# Patient Record
Sex: Female | Born: 2000 | Race: White | Hispanic: No | Marital: Single | Smoking: Never smoker
Health system: Southern US, Community
[De-identification: ages and names within clinical notes are randomized; demographics above are authoritative.]

## PROBLEM LIST (undated history)

## (undated) DIAGNOSIS — O039 Complete or unspecified spontaneous abortion without complication: Secondary | ICD-10-CM

## (undated) DIAGNOSIS — N83209 Unspecified ovarian cyst, unspecified side: Secondary | ICD-10-CM

## (undated) DIAGNOSIS — G43009 Migraine without aura, not intractable, without status migrainosus: Secondary | ICD-10-CM

## (undated) DIAGNOSIS — A749 Chlamydial infection, unspecified: Secondary | ICD-10-CM

## (undated) HISTORY — DX: Chlamydial infection, unspecified: A74.9

## (undated) HISTORY — DX: Migraine without aura, not intractable, without status migrainosus: G43.009

## (undated) HISTORY — PX: WISDOM TOOTH EXTRACTION: SHX21

---
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
## 2018-09-09 DIAGNOSIS — O039 Complete or unspecified spontaneous abortion without complication: Secondary | ICD-10-CM

## 2018-09-09 HISTORY — DX: Complete or unspecified spontaneous abortion without complication: O03.9

## 2018-09-11 ENCOUNTER — Encounter: Payer: Self-pay | Admitting: Certified Nurse Midwife

## 2018-09-11 ENCOUNTER — Other Ambulatory Visit: Payer: Self-pay

## 2018-09-11 ENCOUNTER — Telehealth: Payer: Self-pay

## 2018-09-11 ENCOUNTER — Ambulatory Visit (INDEPENDENT_AMBULATORY_CARE_PROVIDER_SITE_OTHER): Payer: Self-pay | Admitting: Certified Nurse Midwife

## 2018-09-11 ENCOUNTER — Other Ambulatory Visit: Payer: Self-pay | Admitting: Certified Nurse Midwife

## 2018-09-11 VITALS — BP 90/70 | Ht 61.0 in | Wt 112.0 lb

## 2018-09-11 DIAGNOSIS — O209 Hemorrhage in early pregnancy, unspecified: Secondary | ICD-10-CM

## 2018-09-11 NOTE — Progress Notes (Addendum)
Obstetrics & Gynecology Office Visit   Chief Complaint:  Chief Complaint  Patient presents with  . Vaginal Bleeding    since yesterday, severe cramping and lower back pain    History of Present Illness: 18 year old WF G1 P0 with LMP 08/11/2018 had a positive UPT 2 days ago. She has been on OCPs for 2-3 years and had missed some pills, so she took a pregnancy test when there was a delay in onset of her menses earlier this week. She started bleeding and cramping yesterday Am at 0500. She has had light to moderate bleeding since then and continus to have some cramping. She was seen by Dr Noralyn Pick at [facility] yesterday and had a beta HCG done. The results were called to her this AM, but she did not bring those results with her.  She is ambivalent about the pregnancy and is anxious regarding the pregnancy and the bleeding. Has never had a pelvic exam. Past medical history is remarkable for chronic pelvic pain, migraine without aura, and past hx of Chlamydia (2018)    Review of Systems:  Review of Systems  Constitutional: Negative for chills, fever and weight loss.  HENT: Negative for congestion, sinus pain and sore throat.   Eyes: Negative for blurred vision and pain.  Respiratory: Negative for hemoptysis, shortness of breath and wheezing.   Cardiovascular: Negative for chest pain, palpitations and leg swelling.  Gastrointestinal: Positive for abdominal pain (abdominal cramping). Negative for blood in stool, diarrhea, heartburn, nausea and vomiting.  Genitourinary: Negative for dysuria, frequency, hematuria and urgency.       Positive for vaginal bleeding  Musculoskeletal: Negative for back pain, joint pain and myalgias.  Skin: Negative for itching and rash.  Neurological: Positive for dizziness. Negative for tingling and headaches.  Endo/Heme/Allergies: Negative for environmental allergies and polydipsia. Does not bruise/bleed easily.       Negative for hirsutism    Psychiatric/Behavioral: Negative for depression. The patient is not nervous/anxious and does not have insomnia.    -see H&P  Past Medical History:  Past Medical History:  Diagnosis Date  . Chlamydia infection 2018  . Migraine without aura   . Ovarian cyst     Past Surgical History:  Past Surgical History:  Procedure Laterality Date  . WISDOM TOOTH EXTRACTION      Gynecologic History: Patient's last menstrual period was 08/11/2018 (exact date).  Obstetric History: G1P0  Family History:  Family History  Family history unknown: Yes    Social History:  Social History   Socioeconomic History  . Marital status: Single    Spouse name: Not on file  . Number of children: Not on file  . Years of education: Not on file  . Highest education level: Not on file  Occupational History  . Not on file  Social Needs  . Financial resource strain: Not on file  . Food insecurity:    Worry: Not on file    Inability: Not on file  . Transportation needs:    Medical: Not on file    Non-medical: Not on file  Tobacco Use  . Smoking status: Never Smoker  . Smokeless tobacco: Never Used  Substance and Sexual Activity  . Alcohol use: No  . Drug use: Never  . Sexual activity: Yes    Birth control/protection: None  Lifestyle  . Physical activity:    Days per week: Not on file    Minutes per session: Not on file  . Stress: Not  on file  Relationships  . Social connections:    Talks on phone: Not on file    Gets together: Not on file    Attends religious service: Not on file    Active member of club or organization: Not on file    Attends meetings of clubs or organizations: Not on file    Relationship status: Not on file  . Intimate partner violence:    Fear of current or ex partner: Not on file    Emotionally abused: Not on file    Physically abused: Not on file    Forced sexual activity: Not on file  Other Topics Concern  . Not on file  Social History Narrative  . Not on file     Allergies:  No Known Allergies  Medications: Prior to Admission medications   Not on File    Physical Exam Vitals: BP 90/70   Ht 5\' 1"  (1.549 m)   Wt 112 lb (50.8 kg)   LMP 08/11/2018 (Exact Date)   BMI 21.16 kg/m    Physical Exam  Constitutional: She is oriented to person, place, and time. She appears well-developed and well-nourished. No distress.  Respiratory: Effort normal.  GI: Soft. She exhibits no distension and no mass. There is no abdominal tenderness. There is no guarding.  Genitourinary:    Genitourinary Comments: External/BUS: some blood on labia/ no lesions or inflammation Vagina: moderate blood in vault Cervix: cervix appears closed, moble, NT Uterus: AV, NSSC, mobile, NT.    Musculoskeletal: Normal range of motion.  Neurological: She is alert and oriented to person, place, and time.  Skin: Skin is warm and dry.  Psychiatric: She has a normal mood and affect.  Appears anxious   Informal vaginal ultrasound was done which revealed an empty uterus with EM stripe measuring 1.6 mm There were no adnexal masses seen   Assessment: 18 y.o. G1P0 with bleeding in early pregnancy R/O ectopic R/O spontaneous abortion  Plan: ABO RH today To return tomorrow for beta HCG Will call [facility] for beta HCG results yesterday Discussed differential diagnosis with patient and ned for further beta HCGs and possibly another ultrasound to determine diagnosis. Will call patient with results and let her know if Rhogam is indicated  Farrel Conners, CNM    Addendum on 09/13/2018 Beta HCG on 3/18=6 Beta HCG 3/20=1 Blood type: A POS  A: Complete SAB Rhogam not indicated  P: Patient called with results 3/21 and diagnosis Advised can restart pills on 3/22 and use back up birth control x 7 days  Farrel Conners, [place]

## 2018-09-11 NOTE — Telephone Encounter (Signed)
Pt found out at [facility] that she is pregnant.  The blood work that tells how far along she is isn't back yet.  She is bleeding pretty heavily.  What to do?  [phone]  Pt has since called front desk and appt made c [facility] this pm.

## 2018-09-12 ENCOUNTER — Other Ambulatory Visit: Payer: Self-pay

## 2018-09-12 ENCOUNTER — Other Ambulatory Visit (INDEPENDENT_AMBULATORY_CARE_PROVIDER_SITE_OTHER): Payer: Self-pay

## 2018-09-12 DIAGNOSIS — O209 Hemorrhage in early pregnancy, unspecified: Secondary | ICD-10-CM

## 2018-09-12 LAB — ABO/RH: Rh Factor: POSITIVE

## 2018-09-13 DIAGNOSIS — G43009 Migraine without aura, not intractable, without status migrainosus: Secondary | ICD-10-CM | POA: Insufficient documentation

## 2018-09-13 LAB — BETA HCG QUANT (REF LAB): hCG Quant: 1 m[IU]/mL

## 2018-09-13 LAB — ABO/RH: Rh Factor: POSITIVE

## 2018-09-23 ENCOUNTER — Telehealth: Payer: Self-pay

## 2018-09-23 DIAGNOSIS — O039 Complete or unspecified spontaneous abortion without complication: Secondary | ICD-10-CM

## 2018-09-23 DIAGNOSIS — Z3041 Encounter for surveillance of contraceptive pills: Secondary | ICD-10-CM

## 2018-09-23 NOTE — Telephone Encounter (Signed)
Pt was told she has had a complete miscarriage.  She is looking to get back on bcp.  Didn't know if she can do that thru [place] or not.  [phone]

## 2018-09-26 DIAGNOSIS — O039 Complete or unspecified spontaneous abortion without complication: Secondary | ICD-10-CM | POA: Insufficient documentation

## 2018-09-26 DIAGNOSIS — Z309 Encounter for contraceptive management, unspecified: Secondary | ICD-10-CM | POA: Insufficient documentation

## 2018-09-26 MED ORDER — NORETHIN ACE-ETH ESTRAD-FE 1-20 MG-MCG(24) PO TABS: 1.0000 | ORAL_TABLET | Freq: Every day | ORAL | 6 refills | Status: DC

## 2018-09-26 NOTE — Telephone Encounter (Signed)
Patient called. Had a complete SAB recently. Has been prescribed Sprintec Normajean Baxter) in the past by [employer]. Would like to go back on pills. Is no longer bleeding. Advised can prescribe pills for her and have her follow up in 6 months. RX for [employer] 24 sent to [facility] in [place]. Can start 5 April. To use condoms until back on pills two weeks. Aware she may have some BTB on the first pack. Advised to set her alarm on her phone to remind her to take the pills same time every day. Farrel Conners, CNM

## 2018-12-28 ENCOUNTER — Other Ambulatory Visit: Payer: Self-pay

## 2018-12-28 ENCOUNTER — Encounter: Payer: Self-pay | Admitting: Emergency Medicine

## 2018-12-28 ENCOUNTER — Emergency Department
Admission: EM | Admit: 2018-12-28 | Discharge: 2018-12-28 | Disposition: A | Discharge status: Home / Self Care | Payer: PRIVATE HEALTH INSURANCE | Attending: Emergency Medicine | Admitting: Emergency Medicine

## 2018-12-28 DIAGNOSIS — Z79899 Other long term (current) drug therapy: Secondary | ICD-10-CM | POA: Insufficient documentation

## 2018-12-28 DIAGNOSIS — K0889 Other specified disorders of teeth and supporting structures: Secondary | ICD-10-CM

## 2018-12-28 MED ORDER — AMOXICILLIN 875 MG PO TABS: 875.0000 mg | ORAL_TABLET | Freq: Two times a day (BID) | ORAL | 0 refills | Status: DC

## 2018-12-28 MED ORDER — IBUPROFEN 600 MG PO TABS: 600.0000 mg | ORAL_TABLET | Freq: Three times a day (TID) | ORAL | 0 refills | Status: DC | PRN

## 2018-12-28 NOTE — ED Triage Notes (Addendum)
Pt c/o right side dental pain radiating towards ear x 3 days. Pt thinks she has an abscess in mouth.

## 2018-12-28 NOTE — Discharge Instructions (Addendum)
Call tomorrow to make an appointment with your dentist.  Begin taking amoxicillin 875 twice daily for 10 days.  Ibuprofen 600 mg with food as needed for pain.  Also be aware that the amoxicillin will decrease the effects of your birth control pill and use additional protection when having sex.

## 2018-12-28 NOTE — ED Notes (Signed)
See triage note   States she developed pain to right jaw area about 3 days ago    States pain radiates into ear   Denies any dental pain or injury

## 2018-12-28 NOTE — ED Provider Notes (Signed)
[facility] Emergency Department Provider Note   ____________________________________________   First MD Initiated Contact with Patient 12/28/18 1006     (approximate)  I have reviewed the triage vital signs and the nursing notes.   HISTORY  Chief Complaint Dental Pain   HPI Sara Campbell is a 18 y.o. female presents to the ED with complaint of dental pain.  Patient states that her right lower jaw began hurting approximately 3 days ago with some radiation into her right ear.  She denies any fever or chills.  Patient does have a dentist but has not called to make an appointment.  Pain is rated as a 10/10.      Past Medical History:  Diagnosis Date  . Chlamydia infection 2018  . Migraine without aura   . Ovarian cyst     Patient Active Problem List   Diagnosis Date Noted  . SAB (spontaneous abortion) 09/26/2018  . Contraception management 09/26/2018  . Migraine without aura 09/13/2018    Past Surgical History:  Procedure Laterality Date  . WISDOM TOOTH EXTRACTION      Prior to Admission medications   Medication Sig Start Date End Date Taking? Authorizing Provider  amoxicillin (AMOXIL) 875 MG tablet Take 1 tablet (875 mg total) by mouth 2 (two) times daily. 12/28/18   Tommi RumpsSummers, Rhonda L, PA-C  ibuprofen (ADVIL) 600 MG tablet Take 1 tablet (600 mg total) by mouth every 8 (eight) hours as needed. 12/28/18   Tommi RumpsSummers, Rhonda L, PA-C  Norethindrone Acetate-Ethinyl Estrad-FE (JUNEL FE 24) 1-20 MG-MCG(24) tablet Take 1 tablet by mouth daily. 09/26/18   Farrel ConnersGutierrez, Colleen, CNM    Allergies Patient has no known allergies.  Family History  Family history unknown: Yes    Social History Social History   Tobacco Use  . Smoking status: Never Smoker  . Smokeless tobacco: Never Used  Substance Use Topics  . Alcohol use: No  . Drug use: Never    Review of Systems Constitutional: No fever/chills Mouth: Positive for dental pain. Cardiovascular: Denies  chest pain. Respiratory: Denies shortness of breath. Musculoskeletal: Negative for muscle aches. Skin: Negative for rash. Neurological: Negative for headaches, focal weakness or numbness. ___________________________________________   PHYSICAL EXAM:  VITAL SIGNS: ED Triage Vitals  Enc Vitals Group     BP 12/28/18 1003 (!) 138/98     Pulse Rate 12/28/18 1003 90     Resp 12/28/18 1003 18     Temp 12/28/18 1003 99.1 F (37.3 C)     Temp Source 12/28/18 1003 Oral     SpO2 12/28/18 1003 99 %     Weight 12/28/18 1004 112 lb (50.8 kg)     Height 12/28/18 1004 5\' 2"  (1.575 m)     Head Circumference --      Peak Flow --      Pain Score 12/28/18 1004 10     Pain Loc --      Pain Edu? --      Excl. in GC? --     Constitutional: Alert and oriented. Well appearing and in no acute distress. Eyes: Conjunctivae are normal.  Head: Atraumatic. Nose: No congestion/rhinnorhea. Mouth/Throat: Mucous membranes are moist.  Oropharynx non-erythematous.  Moderate tenderness on palpation of the gums on the lower posterior molars.  No actual abscess was seen no drainage.  No dental caries are appreciated. Neck: No stridor.   Cardiovascular: Normal rate, regular rhythm. Grossly normal heart sounds.  Good peripheral circulation. Respiratory: Normal respiratory effort.  No  retractions. Lungs CTAB. Gastrointestinal: Soft and nontender. No distention.  Musculoskeletal: Moves upper and lower extremities without any difficulty.  Normal gait was noted. Neurologic:  Normal speech and language. No gross focal neurologic deficits are appreciated. No gait instability. Skin:  Skin is warm, dry and intact. No rash noted. Psychiatric: Mood and affect are normal. Speech and behavior are normal.  ____________________________________________   LABS (all labs ordered are listed, but only abnormal results are displayed)  Labs Reviewed - No data to display  PROCEDURES  Procedure(s) performed (including Critical  Care):  Procedures   ____________________________________________   INITIAL IMPRESSION / ASSESSMENT AND PLAN / ED COURSE  As part of my medical decision making, I reviewed the following data within the electronic MEDICAL RECORD NUMBER Notes from prior ED visits and [place] Controlled Substance Database  18 year old female presents to the ED with complaint of dental pain for 3 days.  Patient states that she feels like she has an abscess in her mouth.  She denies any fever or chills.  Examination shows moderate tenderness on palpation of the gums right posterior lower without an  abscess seen.  Patient was placed on amoxicillin 875 twice daily for 10 days with ibuprofen as needed for pain.  Patient is to contact her dentist for an appointment.  ____________________________________________   FINAL CLINICAL IMPRESSION(S) / ED DIAGNOSES  Final diagnoses:  Pain, dental     ED Discharge Orders         Ordered    amoxicillin (AMOXIL) 875 MG tablet  2 times daily     12/28/18 1025    ibuprofen (ADVIL) 600 MG tablet  Every 8 hours PRN     12/28/18 1025           Note:  This document was prepared using Dragon voice recognition software and may include unintentional dictation errors.    Johnn Hai, PA-C 12/28/18 1034    Delman Kitten, MD 12/28/18 [phone]

## 2019-02-19 NOTE — Progress Notes (Addendum)
Gynecology Annual Exam  PCP: [facility], [facility]  Chief Complaint:  Chief Complaint  Patient presents with  . Gynecologic Exam    History of Present Illness: Sara Campbell is a 18 y.o. WF,G1P0010, presents for an annual gyn exam. The patient complains of BTB before and sometimes after her placebo pills in her pack of Junel 24. She was begun on Junel 24 after an early SAB in March 2020.  Her last menstrual period was 01/29/2019. She has dysmenorrhea with her bleeding and will sometimes use a heating pad. Last pap smear: NA   The patient is sexually active. She currently uses OCPs for contraception. She does not have dyspareunia.  Since her last visit, she has had no significant changes in her health.  Her past medical history is remarkable for a ruptured ovarian cyst about 2 years ago. She is unsure whether she received Gardasil vaccines.  The patient does perform self breast exams. Her last mammogram was NA.   There is a family history of breast cancer in her maternal grandmother Genetic testing has not been done.  There is no family history of ovarian cancer.   The patient denies smoking.  She denies drinking.   She denies illegal drug use.  The patient reports exercising by playing with and walking her dog..  The patient denies current symptoms of depression.    Review of Systems: Review of Systems  Constitutional: Negative for chills, fever and weight loss.  HENT: Negative for congestion, sinus pain and sore throat.   Eyes: Negative for blurred vision and pain.  Respiratory: Negative for hemoptysis, shortness of breath and wheezing.   Cardiovascular: Negative for chest pain, palpitations and leg swelling.  Gastrointestinal: Negative for abdominal pain, blood in stool, diarrhea, heartburn, nausea and vomiting.  Genitourinary: Negative for dysuria, frequency, hematuria and urgency.       Positive for BTB and dysmenorrhea  Musculoskeletal: Negative for back  pain, joint pain and myalgias.  Skin: Negative for itching and rash.  Neurological: Negative for dizziness, tingling and headaches.  Endo/Heme/Allergies: Negative for environmental allergies and polydipsia. Does not bruise/bleed easily.       Negative for hirsutism   Psychiatric/Behavioral: Negative for depression. The patient is not nervous/anxious and does not have insomnia.     Past Medical History:  Past Medical History:  Diagnosis Date  . Chlamydia infection 2018  . Migraine without aura   . Miscarriage 09/09/2018  . Ovarian cyst     Past Surgical History:  Past Surgical History:  Procedure Laterality Date  . WISDOM TOOTH EXTRACTION      Family History:  Family History  Problem Relation Age of Onset  . Hypertension Mother   . Hypertension Father   . Breast cancer Maternal Grandmother [id number]    Social History:  Social History   Socioeconomic History  . Marital status: Single    Spouse name: Not on file  . Number of children: Not on file  . Years of education: Not on file  . Highest education level: Not on file  Occupational History  . Not on file  Social Needs  . Financial resource strain: Not on file  . Food insecurity    Worry: Not on file    Inability: Not on file  . Transportation needs    Medical: Not on file    Non-medical: Not on file  Tobacco Use  . Smoking status: Never Smoker  . Smokeless tobacco: Never Used  Substance and Sexual  Activity  . Alcohol use: No  . Drug use: Never  . Sexual activity: Yes    Birth control/protection: Pill  Lifestyle  . Physical activity    Days per week: Not on file    Minutes per session: Not on file  . Stress: Not on file  Relationships  . Social Herbalist on phone: Not on file    Gets together: Not on file    Attends religious service: Not on file    Active member of club or organization: Not on file    Attends meetings of clubs or organizations: Not on file    Relationship status: Not on file   . Intimate partner violence    Fear of current or ex partner: Not on file    Emotionally abused: Not on file    Physically abused: Not on file    Forced sexual activity: Not on file  Other Topics Concern  . Not on file  Social History Narrative  . Not on file    Allergies:  No Known Allergies  Medications: Current Outpatient Medications on File Prior to Visit  Medication Sig Dispense Refill  . EPIDUO FORTE 0.3-2.5 % GEL Apply 1 application topically daily.    . Norethindrone Acetate-Ethinyl Estrad-FE (JUNEL FE 24) 1-20 MG-MCG(24) tablet Take 1 tablet by mouth daily. 1 Package 6   No current facility-administered medications on file prior to visit.    Physical Exam Vitals: BP 100/80   Pulse 82   Ht 5\' 2"  (1.575 m)   Wt 113 lb (51.3 kg)   LMP 01/29/2019 (Approximate)   BMI 20.67 kg/m   General: WF in  NAD HEENT: normocephalic, anicteric Neck: no thyroid enlargement, no palpable nodules, no cervical lymphadenopathy  Pulmonary: No increased work of breathing, CTAB Cardiovascular: RRR, without murmur  Breast: Breast symmetrical, no tenderness, no palpable nodules or masses, no skin or nipple retraction present, no nipple discharge.  No axillary, infraclavicular or supraclavicular lymphadenopathy. Abdomen: Soft, non-tender, non-distended.  Umbilicus without lesions.  No hepatomegaly or masses palpable. No evidence of hernia. Genitourinary:  External: Normal external female genitalia.  Normal urethral meatus, normal  Bartholin's and Skene's glands.    Vagina: Normal vaginal mucosa, no evidence of prolapse.    Cervix: posterior, no bleeding, non-tender  Uterus: Anteverted, normal size, shape, and consistency, mobile, and non-tender  Adnexa: No adnexal masses, non-tender  Rectal: deferred  Lymphatic: no evidence of inguinal lymphadenopathy Extremities: no edema, erythema, or tenderness Neurologic: Grossly intact Psychiatric: mood appropriate, affect full     Assessment:  18 y.o. G1P0010 with normal well woman exam BTB on Junel 24  Plan:   1) Breast cancer screening - recommend monthly self breast exam.   2) STI screening was offered and accepted. Aptima done  3) Cervical cancer screening - Pap not indicated. Start Pap smears next year.  4) Contraception - discussed trying an extended use pill like Seasonique to see if bleeding is less as well as dysmenorrhea. Patient willing to try [facility]. Explained how to take and what to do for BTB. RX sent to pharmacy  5) RTO for follow up in 3-4 months.   Dalia Heading, CNM

## 2019-02-20 ENCOUNTER — Encounter: Payer: Self-pay | Admitting: Certified Nurse Midwife

## 2019-02-20 ENCOUNTER — Other Ambulatory Visit (HOSPITAL_COMMUNITY)
Admission: RE | Admit: 2019-02-20 | Discharge: 2019-02-20 | Disposition: A | Discharge status: Home / Self Care | Payer: PRIVATE HEALTH INSURANCE | Source: Ambulatory Visit | Attending: Certified Nurse Midwife | Admitting: Certified Nurse Midwife

## 2019-02-20 ENCOUNTER — Other Ambulatory Visit: Payer: Self-pay

## 2019-02-20 ENCOUNTER — Ambulatory Visit (INDEPENDENT_AMBULATORY_CARE_PROVIDER_SITE_OTHER): Payer: No Typology Code available for payment source | Admitting: Certified Nurse Midwife

## 2019-02-20 VITALS — BP 100/80 | HR 82 | Ht 62.0 in | Wt 113.0 lb

## 2019-02-20 DIAGNOSIS — N946 Dysmenorrhea, unspecified: Secondary | ICD-10-CM

## 2019-02-20 DIAGNOSIS — Z113 Encounter for screening for infections with a predominantly sexual mode of transmission: Secondary | ICD-10-CM | POA: Insufficient documentation

## 2019-02-20 DIAGNOSIS — Z01419 Encounter for gynecological examination (general) (routine) without abnormal findings: Secondary | ICD-10-CM | POA: Diagnosis not present

## 2019-02-20 DIAGNOSIS — Z3041 Encounter for surveillance of contraceptive pills: Secondary | ICD-10-CM

## 2019-02-20 MED ORDER — LEVONORGEST-ETH ESTRAD 91-DAY 0.15-0.03 &0.01 MG PO TABS: 1.0000 | ORAL_TABLET | Freq: Every day | ORAL | 4 refills | Status: DC

## 2019-02-24 LAB — CERVICOVAGINAL ANCILLARY ONLY
Chlamydia: NEGATIVE
Neisseria Gonorrhea: NEGATIVE

## 2019-03-27 IMAGING — CR DG SKULL COMPLETE 4+V
1 series · 4 of 4 positions shown · non-contrast
Comparison: 04/16/2014 CT

CLINICAL DATA: Lump at base of skull

EXAM:
SKULL - COMPLETE 4 + VIEW

[Series 1: dg skull complete · 0.14mm/px · 4 of 4 slices shown]
[im 1/4]
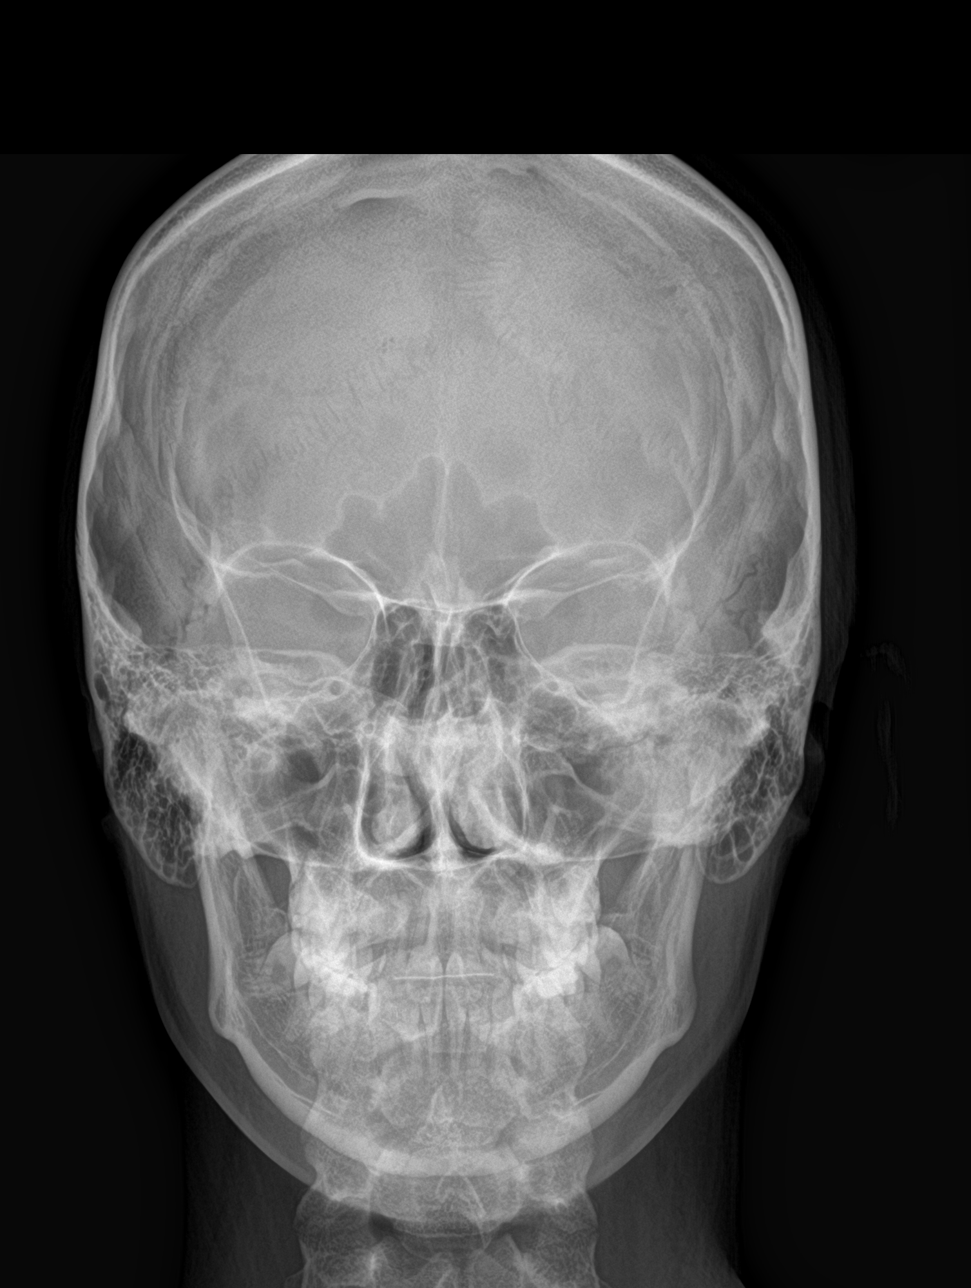
[im 2/4]
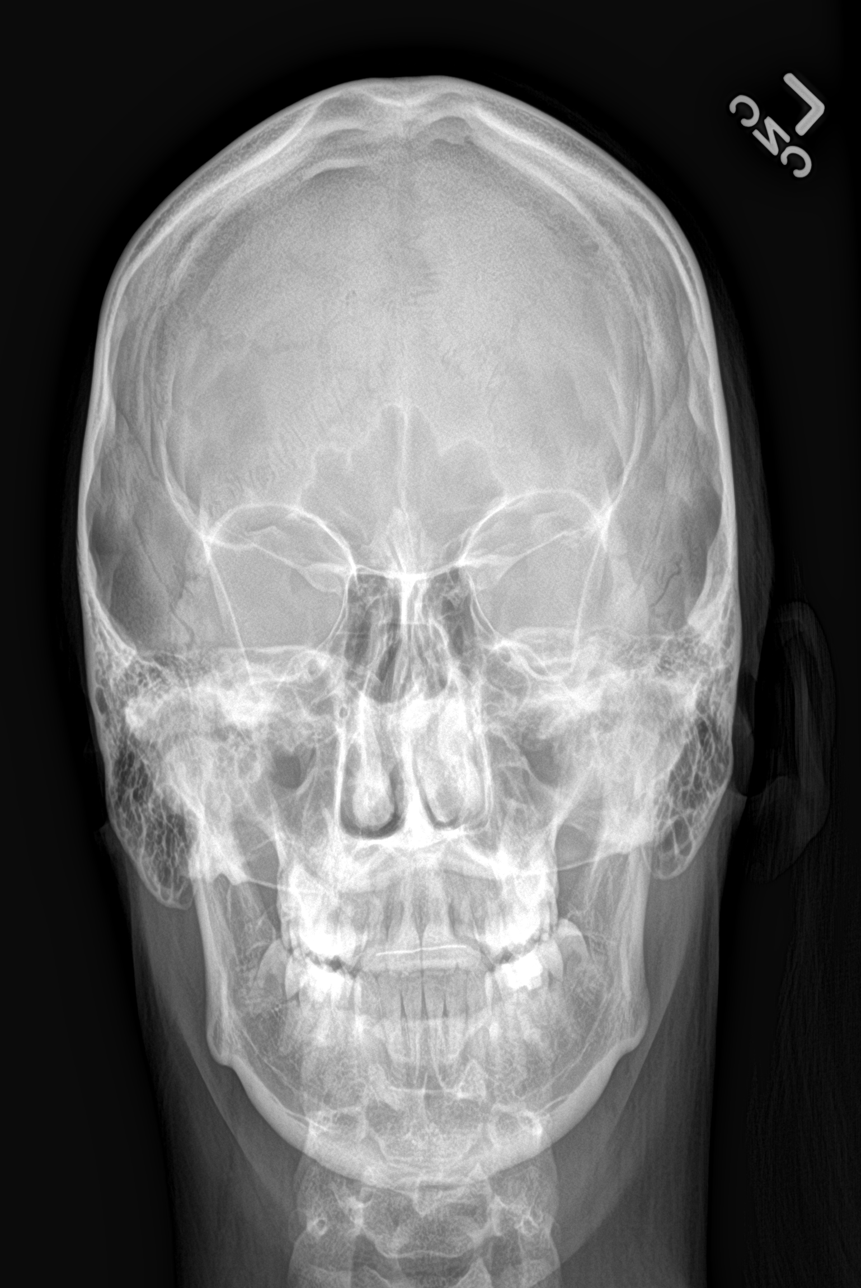
[im 3/4]
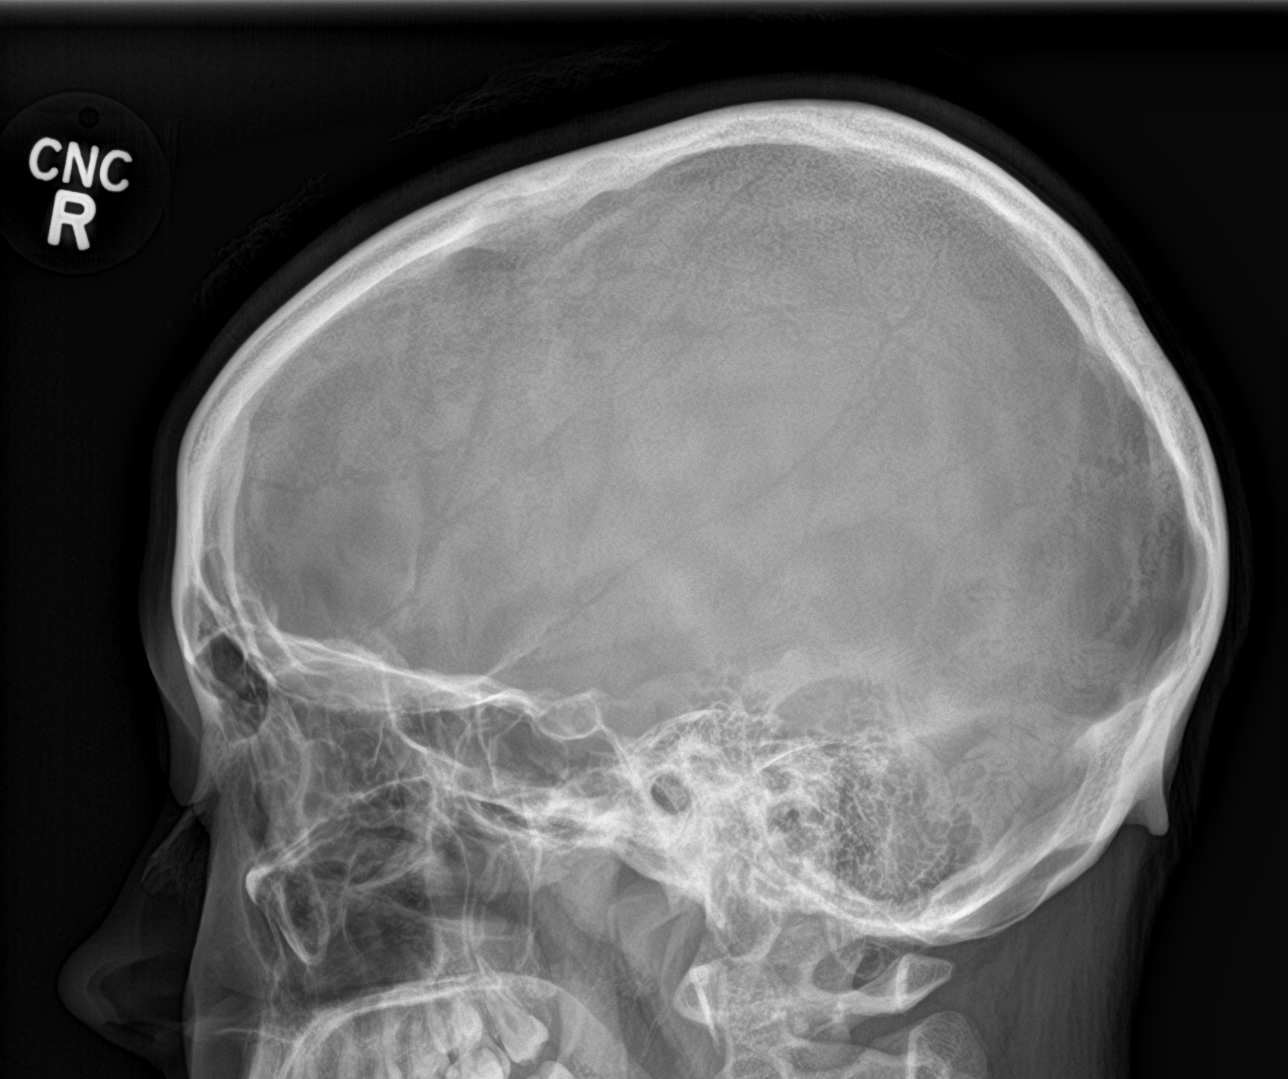
[im 4/4]
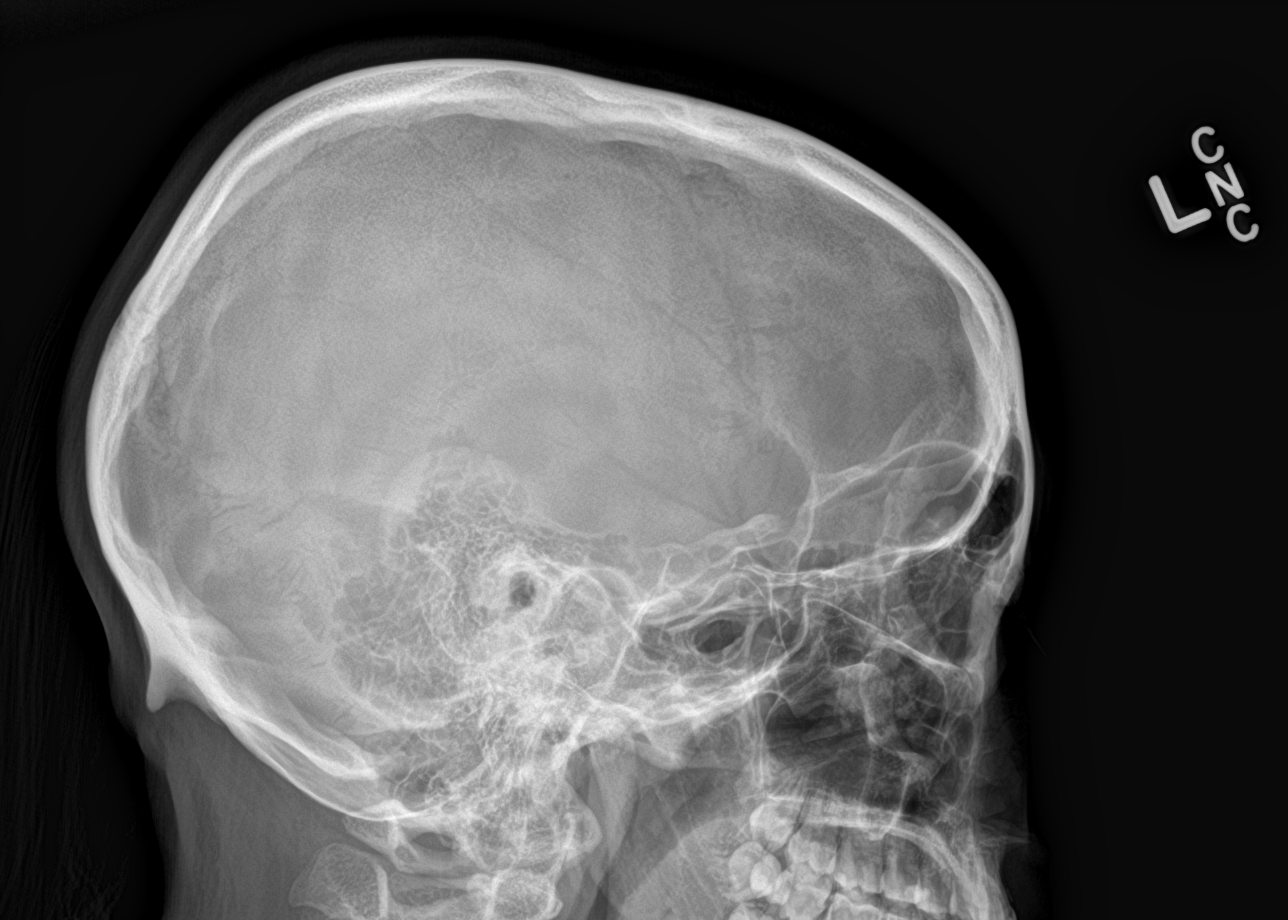

[4 of 4 positions shown; findings below may reference images not displayed]

FINDINGS: Enthesopathic change noted at the base of the skull at the posterior
muscular insertion/origin. This is stable compared to C-spine CT
from 04/16/2014. No acute bony abnormality.
IMPRESSION: Enthesopathic change of the posterior skull, stable since 8687.

No acute bony abnormality..

## 2019-04-13 ENCOUNTER — Telehealth: Payer: Self-pay

## 2019-04-13 NOTE — Telephone Encounter (Signed)
Spoke w/patient. Advised of heavy bleeding protocol. Would need to report to ER if completely filling regular pad and having to change more than 1x qhr. Irregular bleeding is normal for the first 3-6 months after starting new birth control. Providers recommend you complete 3 full packs of OCP prior to changing. Patient advised to monitor and return call with any worsening symptoms/further questions/concerns or if it becomes intolerable for her.

## 2019-04-13 NOTE — Telephone Encounter (Signed)
Patient seen 02/20/2019 and switched to new OCP to have cycle q 3 mos. She is 3-4 weeks into the new OCP and is on day 8 of cycle. She usually has a 3-4 day cycle. Inquiring if this is normal. Cb#[phone]

## 2019-05-18 ENCOUNTER — Other Ambulatory Visit: Payer: Self-pay

## 2019-05-18 ENCOUNTER — Encounter: Payer: Self-pay | Admitting: Emergency Medicine

## 2019-05-18 ENCOUNTER — Emergency Department: Payer: No Typology Code available for payment source

## 2019-05-18 ENCOUNTER — Telehealth: Payer: Self-pay

## 2019-05-18 ENCOUNTER — Emergency Department
Admission: EM | Admit: 2019-05-18 | Discharge: 2019-05-18 | Disposition: A | Discharge status: Home / Self Care | Payer: No Typology Code available for payment source | Attending: Student in an Organized Health Care Education/Training Program | Admitting: Student in an Organized Health Care Education/Training Program

## 2019-05-18 DIAGNOSIS — Z79899 Other long term (current) drug therapy: Secondary | ICD-10-CM | POA: Insufficient documentation

## 2019-05-18 DIAGNOSIS — N939 Abnormal uterine and vaginal bleeding, unspecified: Secondary | ICD-10-CM | POA: Insufficient documentation

## 2019-05-18 LAB — COMPREHENSIVE METABOLIC PANEL
ALT: 15 U/L (ref 0–44)
AST: 17 U/L (ref 15–41)
Albumin: 4.4 g/dL (ref 3.5–5.0)
Alkaline Phosphatase: 69 U/L (ref 38–126)
Anion gap: 8 (ref 5–15)
BUN: 11 mg/dL (ref 6–20)
CO2: 23 mmol/L (ref 22–32)
Calcium: 9.3 mg/dL (ref 8.9–10.3)
Chloride: 107 mmol/L (ref 98–111)
Creatinine, Ser: 0.75 mg/dL (ref 0.44–1.00)
GFR calc Af Amer: >60 mL/min (ref >60)
GFR calc non Af Amer: >60 mL/min (ref >60)
Glucose, Bld: 94 mg/dL (ref 70–99)
Potassium: 3.9 mmol/L (ref 3.5–5.1)
Sodium: 138 mmol/L (ref 135–145)
Total Bilirubin: 0.6 mg/dL (ref 0.3–1.2)
Total Protein: 7.6 g/dL (ref 6.5–8.1)

## 2019-05-18 LAB — CBC
HCT: 41.5 % (ref 36.0–46.0)
Hemoglobin: 14.4 g/dL (ref 12.0–15.0)
MCH: 30.1 pg (ref 26.0–34.0)
MCHC: 34.7 g/dL (ref 30.0–36.0)
MCV: 86.6 fL (ref 80.0–100.0)
Platelets: 185 10*3/uL (ref 150–400)
RBC: 4.79 MIL/uL (ref 3.87–5.11)
RDW: 12.1 % (ref 11.5–15.5)
WBC: 8.6 10*3/uL (ref 4.0–10.5)
nRBC: 0 % (ref 0.0–0.2)

## 2019-05-18 LAB — POCT PREGNANCY, URINE: Preg Test, Ur: NEGATIVE

## 2019-05-18 MED ORDER — MEDROXYPROGESTERONE ACETATE 10 MG PO TABS: 10.0000 mg | ORAL_TABLET | Freq: Three times a day (TID) | ORAL | 0 refills | Status: DC

## 2019-05-18 MED ORDER — ONDANSETRON 4 MG PO TBDP
4.0000 mg | ORAL_TABLET | Freq: Once | ORAL | Status: AC
  Administered 2019-05-18: 4 mg via ORAL
  Filled 2019-05-18: qty 1

## 2019-05-18 MED ORDER — MEDROXYPROGESTERONE ACETATE 10 MG PO TABS
20.0000 mg | ORAL_TABLET | Freq: Every day | ORAL | Status: DC
  Administered 2019-05-18: 20 mg via ORAL
  Filled 2019-05-18: qty 2

## 2019-05-18 NOTE — ED Triage Notes (Signed)
Pt here with c/o vaginal bleeding, switched birth control pills 2 months ago, started her cycle this month on the 11th, had it for 4 days, then started again on the 21st and is heavily bleeding, filling up tampons faster than she ever has, is tired and cramping. NAD.

## 2019-05-18 NOTE — ED Notes (Signed)
See triage note  Presents with vaginal bleeding  Had recent chang in bcp

## 2019-05-18 NOTE — ED Provider Notes (Signed)
[facility] Emergency Department Provider Note    First MD Initiated Contact with Patient 05/18/19 1625     (approximate)  I have reviewed the triage vital signs and the nursing notes.   HISTORY  Chief Complaint Vaginal Bleeding    HPI Sabria Florido is a 18 y.o. female close past medical history presents to the ER for evaluation of intermittent heavy vaginal bleeding since starting OCP Mylan roughly 2 months ago.  States that she started having heavy bleeding with cramping over the weekend.  Not having any fevers or other abnormal discharge.  States that she did have a miscarriage back in March and this is her first OCP since starting that.  Denies any abdominal pain at this time.  Does have a history ovarian cyst but not having any pelvic or lateralizing pain.  States that the intermittent cramping she is having feels consistent with her previous menses.    Past Medical History:  Diagnosis Date  . Chlamydia infection 2018  . Migraine without aura   . Miscarriage 09/09/2018  . Ovarian cyst    Family History  Problem Relation Age of Onset  . Hypertension Mother   . Hypertension Father   . Breast cancer Maternal Grandmother 35   Past Surgical History:  Procedure Laterality Date  . WISDOM TOOTH EXTRACTION     Patient Active Problem List   Diagnosis Date Noted  . SAB (spontaneous abortion) 09/26/2018  . Contraception management 09/26/2018  . Migraine without aura 09/13/2018      Prior to Admission medications   Medication Sig Start Date End Date Taking? Authorizing Provider  EPIDUO FORTE 0.3-2.5 % GEL Apply 1 application topically daily. 11/25/18   [provider]  Levonorgestrel-Ethinyl Estradiol (AMETHIA) 0.15-0.03 &0.01 MG tablet Take 1 tablet by mouth daily. 02/20/19   Farrel Conners, CNM  medroxyPROGESTERone (PROVERA) 10 MG tablet Take 1 tablet (10 mg total) by mouth 3 (three) times daily for 7 days. 05/18/19 05/25/19  Willy Eddy, MD  Norethindrone Acetate-Ethinyl Estrad-FE (JUNEL FE 24) 1-20 MG-MCG(24) tablet Take 1 tablet by mouth daily. 09/26/18   Farrel Conners, CNM    Allergies Patient has no known allergies.    Social History Social History   Tobacco Use  . Smoking status: Never Smoker  . Smokeless tobacco: Never Used  Substance Use Topics  . Alcohol use: No  . Drug use: Never    Review of Systems Patient denies headaches, rhinorrhea, blurry vision, numbness, shortness of breath, chest pain, edema, cough, abdominal pain, nausea, vomiting, diarrhea, dysuria, fevers, rashes or hallucinations unless otherwise stated above in HPI. ____________________________________________   PHYSICAL EXAM:  VITAL SIGNS: Vitals:   05/18/19 1619  BP: 120/83  Pulse: 79  Resp: 17  Temp: 98.1 F (36.7 C)  SpO2: 100%    Constitutional: Alert and oriented.  Eyes: Conjunctivae are normal.  Head: Atraumatic. Nose: No congestion/rhinnorhea. Mouth/Throat: Mucous membranes are moist.   Neck: No stridor. Painless ROM.  Cardiovascular: Normal rate, regular rhythm. Grossly normal heart sounds.  Good peripheral circulation. Respiratory: Normal respiratory effort.  No retractions. Lungs CTAB. Gastrointestinal: Soft and nontender. No distention. No abdominal bruits. No CVA tenderness. Genitourinary: deferred Musculoskeletal: No lower extremity tenderness nor edema.  No joint effusions. Neurologic:  Normal speech and language. No gross focal neurologic deficits are appreciated. No facial droop Skin:  Skin is warm, dry and intact. No rash noted. Psychiatric: Mood and affect are normal. Speech and behavior are normal.  ____________________________________________   LABS (  all labs ordered are listed, but only abnormal results are displayed)  Results for orders placed or performed during the hospital encounter of 05/18/19 (from the past 24 hour(s))  Comprehensive metabolic panel     Status: None   Collection  Time: 05/18/19  2:28 PM  Result Value Ref Range   Sodium 138 135 - 145 mmol/L   Potassium 3.9 3.5 - 5.1 mmol/L   Chloride 107 98 - 111 mmol/L   CO2 23 22 - 32 mmol/L   Glucose, Bld 94 70 - 99 mg/dL   BUN 11 6 - 20 mg/dL   Creatinine, Ser 4.090.75 0.44 - 1.00 mg/dL   Calcium 9.3 8.9 - 81.110.3 mg/dL   Total Protein 7.6 6.5 - 8.1 g/dL   Albumin 4.4 3.5 - 5.0 g/dL   AST 17 15 - 41 U/L   ALT 15 0 - 44 U/L   Alkaline Phosphatase 69 38 - 126 U/L   Total Bilirubin 0.6 0.3 - 1.2 mg/dL   GFR calc non Af Amer >60 >60 mL/min   GFR calc Af Amer >60 >60 mL/min   Anion gap 8 5 - 15  CBC     Status: None   Collection Time: 05/18/19  2:28 PM  Result Value Ref Range   WBC 8.6 4.0 - 10.5 K/uL   RBC 4.79 3.87 - 5.11 MIL/uL   Hemoglobin 14.4 12.0 - 15.0 g/dL   HCT 91.441.5 78.236.0 - 95.646.0 %   MCV 86.6 80.0 - 100.0 fL   MCH 30.1 26.0 - 34.0 pg   MCHC 34.7 30.0 - 36.0 g/dL   RDW 21.312.1 08.611.5 - 57.815.5 %   Platelets 185 150 - 400 K/uL   nRBC 0.0 0.0 - 0.2 %  Pregnancy, urine POC     Status: None   Collection Time: 05/18/19  2:31 PM  Result Value Ref Range   Preg Test, Ur NEGATIVE NEGATIVE   ____________________________________________ ____________________________________________  RADIOLOGY  I personally reviewed all radiographic images ordered to evaluate for the above acute complaints and reviewed radiology reports and findings.  These findings were personally discussed with the patient.  Please see medical record for radiology report.  ____________________________________________   PROCEDURES  Procedure(s) performed:  Procedures    Critical Care performed: no ____________________________________________   INITIAL IMPRESSION / ASSESSMENT AND PLAN / ED COURSE  Pertinent labs & imaging results that were available during my care of the patient were reviewed by me and considered in my medical decision making (see chart for details).   DDX: AB, DU B, ectopic, retained POC, hemorrhagic cyst  Rosalva FerronHannah  Wilsey is a 18 y.o. who presents to the ED with symptoms as described above.  She arrives afebrile hemodynamically stable in no acute distress.  Given her recent miscarriage recently started on OCP I do feel that ultrasound indicated to exclude retained products conception or other anatomic abnormality to explain the patient's heavy bleeding.  She is not pregnant and blood work is otherwise reassuring.  Ultrasound without any evidence of anatomic abnormality.  She is hemodynamically stable.  Discussed the case in consultation with Dr. Jean RosenthalJackson who has recommended 20 mg of Provera 3 times daily for 1 week with close outpatient follow-up.  Patient demonstrates understanding and agreement with work-up thus far as well as results and plan.     The patient was evaluated in Emergency Department today for the symptoms described in the history of present illness. He/she was evaluated in the context of the global COVID-19 pandemic, which  necessitated consideration that the patient might be at risk for infection with the SARS-CoV-2 virus that causes COVID-19. Institutional protocols and algorithms that pertain to the evaluation of patients at risk for COVID-19 are in a state of rapid change based on information released by regulatory bodies including the CDC and federal and state organizations. These policies and algorithms were followed during the patient's care in the ED.  As part of my medical decision making, I reviewed the following data within the [facility] notes reviewed and incorporated, Labs reviewed, notes from prior ED visits and [place] Controlled Substance Database   ____________________________________________   FINAL CLINICAL IMPRESSION(S) / ED DIAGNOSES  Final diagnoses:  Episode of heavy vaginal bleeding      NEW MEDICATIONS STARTED DURING THIS VISIT:  New Prescriptions   MEDROXYPROGESTERONE (PROVERA) 10 MG TABLET    Take 1 tablet (10 mg total) by mouth 3 (three)  times daily for 7 days.     Note:  This document was prepared using Dragon voice recognition software and may include unintentional dictation errors.    Merlyn Lot, MD 05/18/19 [phone]

## 2019-05-18 NOTE — Telephone Encounter (Signed)
Pt calling triage line stating she has been on the 3 month regimen of birthcontrol. She is now experiencing heavy bleeding while on third pack. She wants to speak to [place] or her nurse. CB# [phone]

## 2019-05-18 NOTE — Telephone Encounter (Signed)
Pt on work call; will call back.

## 2019-05-18 NOTE — Telephone Encounter (Signed)
Pt states she is changing a tampon every hour to hour and a half.  Adv of our protocol.  Pt aware it takes a good 3-6 months for body to adjust to bc.  Pt's bleeding is irregularly.  She stopped bleeding on the 14th and started again on the 21st.  Pt also aware to be seen if feels weak, tired, not well.

## 2019-05-27 ENCOUNTER — Other Ambulatory Visit: Payer: Self-pay

## 2019-05-27 ENCOUNTER — Ambulatory Visit (INDEPENDENT_AMBULATORY_CARE_PROVIDER_SITE_OTHER): Payer: No Typology Code available for payment source | Admitting: Obstetrics and Gynecology

## 2019-05-27 ENCOUNTER — Encounter: Payer: Self-pay | Admitting: Obstetrics and Gynecology

## 2019-05-27 ENCOUNTER — Other Ambulatory Visit (HOSPITAL_COMMUNITY)
Admission: RE | Admit: 2019-05-27 | Discharge: 2019-05-27 | Disposition: A | Discharge status: Home / Self Care | Payer: No Typology Code available for payment source | Source: Ambulatory Visit | Attending: Obstetrics and Gynecology | Admitting: Obstetrics and Gynecology

## 2019-05-27 VITALS — BP 111/82 | HR 84 | Ht 61.0 in | Wt 115.0 lb

## 2019-05-27 DIAGNOSIS — Z113 Encounter for screening for infections with a predominantly sexual mode of transmission: Secondary | ICD-10-CM | POA: Insufficient documentation

## 2019-05-27 DIAGNOSIS — N926 Irregular menstruation, unspecified: Secondary | ICD-10-CM | POA: Diagnosis present

## 2019-05-27 DIAGNOSIS — Z3202 Encounter for pregnancy test, result negative: Secondary | ICD-10-CM

## 2019-05-27 DIAGNOSIS — Z30430 Encounter for insertion of intrauterine contraceptive device: Secondary | ICD-10-CM

## 2019-05-27 LAB — POCT URINE PREGNANCY: Preg Test, Ur: NEGATIVE

## 2019-05-27 MED ORDER — LEVONORGESTREL 20 MCG/24HR IU IUD: 1.0000 | INTRAUTERINE_SYSTEM | Freq: Once | INTRAUTERINE | 0 refills | Status: DC

## 2019-05-27 NOTE — Patient Instructions (Signed)
Intrauterine Device Insertion, Care After  This sheet gives you information about how to care for yourself after your procedure. Your health care provider may also give you more specific instructions. If you have problems or questions, contact your health care provider. What can I expect after the procedure? After the procedure, it is common to have:  Cramps and pain in the abdomen.  Light bleeding (spotting) or heavier bleeding that is like your menstrual period. This may last for up to a few days.  Lower back pain.  Dizziness.  Headaches.  Nausea. Follow these instructions at home:  Before resuming sexual activity, check to make sure that you can feel the IUD string(s). You should be able to feel the end of the string(s) below the opening of your cervix. If your IUD string is in place, you may resume sexual activity. ? If you had a hormonal IUD inserted more than 7 days after your most recent period started, you will need to use a backup method of birth control for 7 days after IUD insertion. Ask your health care provider whether this applies to you.  Continue to check that the IUD is still in place by feeling for the string(s) after every menstrual period, or once a month.  Take over-the-counter and prescription medicines only as told by your health care provider.  Do not drive or use heavy machinery while taking prescription pain medicine.  Keep all follow-up visits as told by your health care provider. This is important. Contact a health care provider if:  You have bleeding that is heavier or lasts longer than a normal menstrual cycle.  You have a fever.  You have cramps or abdominal pain that get worse or do not get better with medicine.  You develop abdominal pain that is new or is not in the same area of earlier cramping and pain.  You feel lightheaded or weak.  You have abnormal or bad-smelling discharge from your vagina.  You have pain during sexual activity.   You have any of the following problems with your IUD string(s): ? The string bothers or hurts you or your sexual partner. ? You cannot feel the string. ? The string has gotten longer.  You can feel the IUD in your vagina.  You think you may be pregnant, or you miss your menstrual period.  You think you may have an STI (sexually transmitted infection). Get help right away if:  You have flu-like symptoms.  You have a fever and chills.  You can feel that your IUD has slipped out of place. Summary  After the procedure, it is common to have cramps and pain in the abdomen. It is also common to have light bleeding (spotting) or heavier bleeding that is like your menstrual period.  Continue to check that the IUD is still in place by feeling for the string(s) after every menstrual period, or once a month.  Keep all follow-up visits as told by your health care provider. This is important.  Contact your health care provider if you have problems with your IUD string(s), such as the string getting longer or bothering you or your sexual partner. This information is not intended to replace advice given to you by your health care provider. Make sure you discuss any questions you have with your health care provider. Document Released: 02/07/2011 Document Revised: 05/24/2017 Document Reviewed: 05/02/2016 Elsevier Patient Education  2020 [employer]  

## 2019-05-27 NOTE — Progress Notes (Signed)
Obstetrics & Gynecology Office Visit   Chief Complaint:  Chief Complaint  Patient presents with  . ER follow up bleeding    irregularbleeding on curent birthcontrol OCP    History of Present Illness: 18 y.o. G44P0010 female who presents in follow up from an ER visit on 05/18/2019. She is taking Seasonale for contraception.  She started taking the birth control in August of this year.  She also had a miscarriage in March 2020. She took Ragsdale for a little while after her miscarriage.  Even with Junel and Seasonale she has never had a regular period.  She states that she also has very bad cramps with her periods.  Prior to her miscarriage she was taking Junel and she was still having irregular periods. She was seeing her pediatrician who was making adjustments to her birth control without success.  She states that she is not as good at taking the pills every day.  But, she states that she takes the pill most of the time.  She would like to switch types of contraception to something she doesn't have to remember to take.    Past Medical History:  Diagnosis Date  . Chlamydia infection 2018  . Migraine without aura   . Miscarriage 09/09/2018  . Ovarian cyst    Past Surgical History:  Procedure Laterality Date  . WISDOM TOOTH EXTRACTION     Gynecologic History: No LMP recorded. (Menstrual status: Irregular Periods).  Obstetric History: G1P0010  Family History  Problem Relation Age of Onset  . Hypertension Mother   . Hypertension Father   . Breast cancer Maternal Grandmother 70    Social History   Socioeconomic History  . Marital status: Single    Spouse name: Not on file  . Number of children: Not on file  . Years of education: Not on file  . Highest education level: Not on file  Occupational History  . Not on file  Social Needs  . Financial resource strain: Not on file  . Food insecurity    Worry: Not on file    Inability: Not on file  . Transportation needs    Medical:  Not on file    Non-medical: Not on file  Tobacco Use  . Smoking status: Never Smoker  . Smokeless tobacco: Never Used  Substance and Sexual Activity  . Alcohol use: No  . Drug use: Never  . Sexual activity: Yes    Birth control/protection: Pill  Lifestyle  . Physical activity    Days per week: Not on file    Minutes per session: Not on file  . Stress: Not on file  Relationships  . Social Herbalist on phone: Not on file    Gets together: Not on file    Attends religious service: Not on file    Active member of club or organization: Not on file    Attends meetings of clubs or organizations: Not on file    Relationship status: Not on file  . Intimate partner violence    Fear of current or ex partner: Not on file    Emotionally abused: Not on file    Physically abused: Not on file    Forced sexual activity: Not on file  Other Topics Concern  . Not on file  Social History Narrative  . Not on file    No Known Allergies  Prior to Admission medications   Medication Sig Start Date End Date Taking? Authorizing Provider  Adapalene-Benzoyl Peroxide (  EPIDUO FORTE) 0.3-2.5 % GEL Apply topically. 11/25/18  Yes [provider]  EPIDUO FORTE 0.3-2.5 % GEL Apply 1 application topically daily. 11/25/18  Yes [provider]  levonorgestrel-ethinyl estradiol (SEASONALE) 0.15-0.03 MG tablet Take by mouth.    [provider]  medroxyPROGESTERone (PROVERA) 10 MG tablet Take 1 tablet (10 mg total) by mouth 3 (three) times daily for 7 days. 05/18/19 05/25/19  Willy Eddyobinson, Patrick, MD  spironolactone (ALDACTONE) 50 MG tablet Take 50 mg by mouth 2 (two) times daily. 03/24/19   [provider]    Review of Systems  Constitutional: Positive for malaise/fatigue. Negative for chills, diaphoresis, fever and weight loss.  HENT: Negative.   Eyes: Negative.   Respiratory: Negative.   Cardiovascular: Negative.   Gastrointestinal: Negative.   Genitourinary:  Negative.        See HPI  Musculoskeletal: Negative.   Skin: Negative.   Neurological: Positive for dizziness and headaches. Negative for tingling, tremors, sensory change, speech change, focal weakness, seizures, loss of consciousness and weakness.  Psychiatric/Behavioral: Negative.      Physical Exam BP 111/82 (BP Location: Left Arm, Patient Position: Sitting, Cuff Size: Normal)   Pulse 84   Ht 5\' 1"  (1.549 m)   Wt 115 lb (52.2 kg)   BMI 21.73 kg/m  No LMP recorded. (Menstrual status: Irregular Periods). Physical Exam Constitutional:      General: She is not in acute distress.    Appearance: Normal appearance. She is well-developed.  Genitourinary:     Pelvic exam was performed with patient in the lithotomy position.     Vulva, inguinal canal, urethra, bladder, vagina, uterus, right adnexa and left adnexa normal.     No posterior fourchette tenderness, injury or lesion present.     No cervical friability, lesion, bleeding or polyp.  HENT:     Head: Normocephalic and atraumatic.  Eyes:     General: No scleral icterus.    Conjunctiva/sclera: Conjunctivae normal.  Neck:     Musculoskeletal: Normal range of motion and neck supple.  Cardiovascular:     Rate and Rhythm: Normal rate and regular rhythm.     Heart sounds: No murmur. No friction rub. No gallop.   Pulmonary:     Effort: Pulmonary effort is normal. No respiratory distress.     Breath sounds: Normal breath sounds. No wheezing or rales.  Abdominal:     General: Bowel sounds are normal. There is no distension.     Palpations: Abdomen is soft. There is no mass.     Tenderness: There is no abdominal tenderness. There is no guarding or rebound.  Musculoskeletal: Normal range of motion.  Neurological:     General: No focal deficit present.     Mental Status: She is alert and oriented to person, place, and time.     Cranial Nerves: No cranial nerve deficit.  Skin:    General: Skin is warm and dry.     Findings: No  erythema.  Psychiatric:        Mood and Affect: Mood normal.        Behavior: Behavior normal.        Judgment: Judgment normal.  Vitals signs reviewed. Exam conducted with a chaperone present.     IUD Insertion Procedure Note Patient identified, informed consent performed, consent signed.   Discussed risks of irregular bleeding, cramping, infection, malpositioning, expulsion or uterine perforation of the IUD (1:1000 placements)  which may require further procedure such as laparoscopy.  IUD while  effective at preventing pregnancy do not prevent transmission of sexually transmitted diseases and use of barrier methods for this purpose was discussed. Time out was performed.  Urine pregnancy test negative.  Speculum placed in the vagina.  Cervix visualized.  Cleaned with Betadine x 2.  Grasped anteriorly with a single tooth tenaculum.  Uterus sounded to 7.5 cm. IUD placed per manufacturer's recommendations.  Strings trimmed to 3 cm. Tenaculum was removed, good hemostasis noted.  Patient tolerated procedure well.   Patient was given post-procedure instructions.  She was advised to have backup contraception for one week.  Patient was also asked to check IUD strings periodically and follow up in 4 weeks for IUD check.   Female chaperone present for pelvic and breast  portions of the physical exam  Assessment: 18 y.o. G103P0010 female here for  1. Irregular menses   2. Encounter for insertion of intrauterine contraceptive device (IUD)   3. Screen for STD (sexually transmitted disease)      Plan: Problem List Items Addressed This Visit      Other   Contraception management   Relevant Medications   levonorgestrel (MIRENA) 20 MCG/24HR IUD    Other Visit Diagnoses    Irregular menses    -  Primary   Relevant Medications   levonorgestrel (MIRENA) 20 MCG/24HR IUD   Other Relevant Orders   POCT urine pregnancy (Completed)   Cervicovaginal ancillary only   Screen for STD (sexually transmitted  disease)       Relevant Orders   Cervicovaginal ancillary only     Discussed potential etiologies for her irregular bleeding.  Given her age and pattern of bleeding, will start with treatment first.  If bleeding continues and is uncontrolled, will move to ultrasound to see if there is a structural abnormality.  STD screening testing performed today as she had an IUD placed.  She greatly desires to have birth control that was easy to monitor and maintain with an improved bleeding side effect profile.  Discussed multiple types of birth control.  In the end, she elected to have an IUD placed.  We discussed the irregular bleeding pattern that could be present for several months with the IUD.  However, long-term the IUD should provide a better bleeding side effect profile.  Thomasene Mohair, MD 05/27/2019 12:12 PM

## 2019-05-28 LAB — CERVICOVAGINAL ANCILLARY ONLY
Chlamydia: NEGATIVE
Comment: NEGATIVE
Comment: NEGATIVE
Comment: NORMAL
Neisseria Gonorrhea: NEGATIVE
Trichomonas: NEGATIVE

## 2019-06-24 ENCOUNTER — Ambulatory Visit: Payer: No Typology Code available for payment source | Admitting: Obstetrics and Gynecology

## 2019-11-13 ENCOUNTER — Other Ambulatory Visit: Payer: Self-pay

## 2019-11-13 ENCOUNTER — Ambulatory Visit (INDEPENDENT_AMBULATORY_CARE_PROVIDER_SITE_OTHER): Payer: No Typology Code available for payment source | Admitting: Advanced Practice Midwife

## 2019-11-13 ENCOUNTER — Encounter: Payer: Self-pay | Admitting: Advanced Practice Midwife

## 2019-11-13 VITALS — BP 120/80 | Ht 62.0 in | Wt 118.0 lb

## 2019-11-13 DIAGNOSIS — Z30431 Encounter for routine checking of intrauterine contraceptive device: Secondary | ICD-10-CM

## 2019-11-13 NOTE — Patient Instructions (Signed)
Dysmenorrhea  Dysmenorrhea refers to cramps caused by the muscles of the uterus tightening (contracting) during a menstrual period. Dysmenorrhea may be mild, or it may be severe enough to interfere with everyday activities for a few days each month. Primary dysmenorrhea is menstrual cramps that last a couple of days when you start having menstrual periods or soon after. This often begins after a teenager starts having her period. As a woman gets older or has a baby, the cramps will usually lessen or disappear. Secondary dysmenorrhea begins later in life and is caused by a disorder in the reproductive system. It lasts longer, and it may cause more pain than primary dysmenorrhea. The pain may start before the period and last a few days after the period. What are the causes? Dysmenorrhea is usually caused by an underlying problem, such as:  The tissue that lines the uterus (endometrium) growing outside of the uterus in other areas of the body (endometriosis).  Endometrial tissue growing into the muscular walls of the uterus (adenomyosis).  Blood vessels in the pelvis becoming filled with blood just before the menstrual period (pelvic congestive syndrome).  Overgrowth of cells (polyps) in the endometrium or the lower part of the uterus (cervix).  The uterus dropping down into the vagina (prolapse) due to stretched or weak muscles.  Bladder problems, such as infection or inflammation.  Intestinal problems, such as a tumor or irritable bowel syndrome.  Cancer of the reproductive organs or bladder.  A severely tipped uterus.  A cervix that is closed or has a very small opening.  Noncancerous (benign) tumors of the uterus (fibroids).  Pelvic inflammatory disease (PID).  Pelvic scarring (adhesions) from a previous surgery.  An ovarian cyst.  An IUD (intrauterine device). What increases the risk? You are more likely to develop this condition if:  You are younger than age 67.  You  started puberty early.  You have irregular or heavy bleeding.  You have never given birth.  You have a family history of dysmenorrhea.  You smoke. What are the signs or symptoms? Symptoms of this condition include:  Cramping, throbbing pain, or a feeling of fullness in the lower abdomen.  Lower back pain.  Periods lasting for longer than 7 days.  Headaches.  Bloating.  Fatigue.  Nausea or vomiting.  Diarrhea.  Sweating or dizziness.  Loose stools. How is this diagnosed? This condition may be diagnosed based on:  Your symptoms.  Your medical history.  A physical exam.  Blood tests.  A Pap test. This is a test in which cells from the cervix are tested for signs of cancer or infection.  A pregnancy test.  Imaging tests, such as: ? Ultrasound. ? A procedure to remove and examine a sample of endometrial tissue (dilation and curettage, D&C). ? A procedure to visually examine the inside of:  The uterus (hysteroscopy).  The abdomen or pelvis (laparoscopy).  The bladder (cystoscopy).  The intestine (colonoscopy).  The stomach (gastroscopy). ? X-rays. ? CT scan. ? MRI. How is this treated? Treatment depends on the cause of the dysmenorrhea. Treatment may include:  Pain medicine prescribed by your health care provider.  Birth control pills that contain the hormone progesterone.  An IUD that contains the hormone progesterone.  Medicines to control bleeding.  Hormone replacement therapy.  NSAIDs. These may help to stop the production of hormones that cause cramps.  Antidepressant medicines.  Surgery to remove adhesions, endometriosis, ovarian cysts, fibroids, or the entire uterus (hysterectomy).  Injections of progesterone  to stop the menstrual period.  A procedure to destroy the endometrium (endometrial ablation).  A procedure to cut the nerves in the bottom of the spine (sacrum) that go to the reproductive organs (presacral neurectomy).  A  procedure to apply an electric current to nerves in the sacrum (sacral nerve stimulation).  Exercise and physical therapy.  Meditation and yoga therapy.  Acupuncture. Work with your health care provider to determine what treatment or combination of treatments is best for you. Follow these instructions at home: Relieving pain and cramping  Apply heat to your lower back or abdomen when you experience pain or cramps. Use the heat source that your health care provider recommends, such as a moist heat pack or a heating pad. ? Place a towel between your skin and the heat source. ? Leave the heat on for 20-30 minutes. ? Remove the heat if your skin turns bright red. This is especially important if you are unable to feel pain, heat, or cold. You may have a greater risk of getting burned. ? Do not sleep with a heating pad on.  Do aerobic exercises, such as walking, swimming, or biking. This can help to relieve cramps.  Massage your lower back or abdomen to help relieve pain. General instructions  Take over-the-counter and prescription medicines only as told by your health care provider.  Do not drive or use heavy machinery while taking prescription pain medicine.  Avoid alcohol and caffeine during and right before your menstrual period. These can make cramps worse.  Do not use any products that contain nicotine or tobacco, such as cigarettes and e-cigarettes. If you need help quitting, ask your health care provider.  Keep all follow-up visits as told by your health care provider. This is important. Contact a health care provider if:  You have pain that gets worse or does not get better with medicine.  You have pain with sex.  You develop nausea or vomiting with your period that is not controlled with medicine. Get help right away if:  You faint. Summary  Dysmenorrhea refers to cramps caused by the muscles of the uterus tightening (contracting) during a menstrual  period.  Dysmenorrhea may be mild, or it may be severe enough to interfere with everyday activities for a few days each month.  Treatment depends on the cause of the dysmenorrhea.  Work with your health care provider to determine what treatment or combination of treatments is best for you. This information is not intended to replace advice given to you by your health care provider. Make sure you discuss any questions you have with your health care provider. Document Revised: 05/24/2017 Document Reviewed: 07/14/2016 Elsevier Patient Education  2020 [employer]  

## 2019-11-13 NOTE — Progress Notes (Signed)
Patient ID: Sara Campbell, female   DOB: 09/23/2000, 19 y.o.   MRN: [id number]  Reason for Consult: Contraception (iud check )    Subjective:  HPI:  Sara Campbell is a 19 y.o. female being seen for IUD check up. She had Mirena IUD placed about 5 months ago. She did not return for 4 week visit after insertion. She has had regular monthly bleeding since insertion. Her expected cycle this month is delayed and she had severe cramping this morning. Her main concern is whether the IUD is still in place and is it normal to have irregular bleeding and cramping. We discussed normal variations and how to self check her strings.  She is reassured today since her strings are visible and appear to be the correct length. She understands that adjustment time can be up to about 6 months and we can do an ultrasound to check position if her symptoms worsen.  Past Medical History:  Diagnosis Date  . Chlamydia infection 2018  . Migraine without aura   . Miscarriage 09/09/2018  . Ovarian cyst    Family History  Problem Relation Age of Onset  . Hypertension Mother   . Hypertension Father   . Breast cancer Maternal Grandmother 61   Past Surgical History:  Procedure Laterality Date  . WISDOM TOOTH EXTRACTION      Short Social History:  Social History   Tobacco Use  . Smoking status: Never Smoker  . Smokeless tobacco: Never Used  Substance Use Topics  . Alcohol use: No    No Known Allergies  Current Outpatient Medications  Medication Sig Dispense Refill  . EPIDUO FORTE 0.3-2.5 % GEL Apply 1 application topically daily.    Marland Kitchen spironolactone (ALDACTONE) 50 MG tablet Take 50 mg by mouth 2 (two) times daily.    . Adapalene-Benzoyl Peroxide (EPIDUO FORTE) 0.3-2.5 % GEL Apply topically.    Marland Kitchen levonorgestrel (MIRENA) 20 MCG/24HR IUD 1 Intra Uterine Device (1 each total) by Intrauterine route once for 1 dose. 1 each 0  . Levonorgestrel-Ethinyl Estradiol (AMETHIA) 0.15-0.03 &0.01 MG tablet Take 1 tablet  by mouth daily. (Patient not taking: Reported on 11/13/2019) 1 Package 4  . levonorgestrel-ethinyl estradiol (SEASONALE) 0.15-0.03 MG tablet Take by mouth.    . medroxyPROGESTERone (PROVERA) 10 MG tablet Take 1 tablet (10 mg total) by mouth 3 (three) times daily for 7 days. 21 tablet 0  . Norethindrone Acetate-Ethinyl Estrad-FE (JUNEL FE 24) 1-20 MG-MCG(24) tablet Take 1 tablet by mouth daily. (Patient not taking: Reported on 11/13/2019) 1 Package 6   No current facility-administered medications for this visit.    Review of Systems  Constitutional: Negative for chills and fever.  HENT: Negative for congestion, ear discharge, ear pain, hearing loss, sinus pain and sore throat.   Eyes: Negative for blurred vision and double vision.  Respiratory: Negative for cough, shortness of breath and wheezing.   Cardiovascular: Negative for chest pain, palpitations and leg swelling.  Gastrointestinal: Negative for abdominal pain, blood in stool, constipation, diarrhea, heartburn, melena, nausea and vomiting.  Genitourinary: Negative for dysuria, flank pain, frequency, hematuria and urgency.       Positive for pelvic cramping  Musculoskeletal: Negative for back pain, joint pain and myalgias.  Skin: Negative for itching and rash.  Neurological: Negative for dizziness, tingling, tremors, sensory change, speech change, focal weakness, seizures, loss of consciousness, weakness and headaches.  Endo/Heme/Allergies: Negative for environmental allergies. Does not bruise/bleed easily.  Psychiatric/Behavioral: Negative for depression, hallucinations, memory loss, substance abuse and  suicidal ideas. The patient is not nervous/anxious and does not have insomnia.         Objective:  Objective   Vitals:   11/13/19 1129  BP: 120/80  Weight: 118 lb (53.5 kg)  Height: 5\' 2"  (1.575 m)   Body mass index is 21.58 kg/m. Constitutional: Well nourished, well developed female in no acute distress.  HEENT: normal Skin:  Warm and dry.  Cardiovascular: Regular rate and rhythm.   Respiratory: Clear to auscultation bilateral. Normal respiratory effort Abdomen: soft, nontender, nondistended, no abnormal masses, no epigastric pain Neuro: DTRs 2+, Cranial nerves grossly intact Psych: Alert and Oriented x3. No memory deficits. Normal mood and affect.  MS: normal gait, normal bilateral lower extremity ROM/strength/stability.  Pelvic exam:  is not limited by body habitus EGBUS: within normal limits Vagina: within normal limits and with normal mucosa, blood in the vault Cervix: normal appearance, IUD strings visualized, relaxed against cervix and appear to be 2-3 cm.    Assessment/Plan:     19 y.o. G1 P42 female IUD in place  Return to clinic as needed for worsening symptoms and for annual exam   P80 CNM [facility] 11/13/2019, 1:39 PM

## 2019-12-16 ENCOUNTER — Telehealth: Payer: Self-pay

## 2019-12-16 NOTE — Telephone Encounter (Signed)
Pt calling; has IUD; 2d ago started feeling bad; last week started cramping ; last month didn't bleed at all; this month started spotting then it got heavy enough to wear a tampon; has been filling a tampon for 3-4 days now; today, no bleeding but tampon was full of clear liquid/d/c after being in for 3-31/2 hours; yesterday started feeling bad c back pain, tired/exhaused, wants to sleep all the time, feels sick but no congestion, sneezing, or runny nose.  What to do?  [phone]

## 2019-12-16 NOTE — Telephone Encounter (Signed)
I would like to defer this back to SDJ since he placed the IUD. I just saw her for a string check recently and everything appeared normal at that time. Please send to SDJ. Thanks

## 2019-12-17 NOTE — Telephone Encounter (Signed)
You can try to get her in with me next week (I think I have some openings in [place] Thursday). Otherwise, if she continues to feel bad, she'll need to come in and get a pelvic ultrasound and be assessed for infection.

## 2019-12-18 NOTE — Telephone Encounter (Signed)
Voicemail box full unable to leave message.  

## 2019-12-18 NOTE — Telephone Encounter (Signed)
Pt aware.  SP to call pt to schedule as pt hung up before I could get SP.

## 2019-12-23 NOTE — Telephone Encounter (Signed)
Pt hasn't confirmed.  Unable to leave vm - mailbox is full.

## 2019-12-24 ENCOUNTER — Ambulatory Visit: Payer: No Typology Code available for payment source | Admitting: Obstetrics and Gynecology

## 2020-02-11 ENCOUNTER — Ambulatory Visit
Admission: EM | Admit: 2020-02-11 | Discharge: 2020-02-11 | Disposition: A | Discharge status: Home / Self Care | Payer: No Typology Code available for payment source | Attending: Physician Assistant | Admitting: Physician Assistant

## 2020-02-11 ENCOUNTER — Other Ambulatory Visit: Payer: Self-pay

## 2020-02-11 DIAGNOSIS — M545 Low back pain, unspecified: Secondary | ICD-10-CM

## 2020-02-11 DIAGNOSIS — R21 Rash and other nonspecific skin eruption: Secondary | ICD-10-CM | POA: Diagnosis not present

## 2020-02-11 MED ORDER — METHYLPREDNISOLONE 4 MG PO TBPK: ORAL_TABLET | ORAL | 0 refills | Status: DC

## 2020-02-11 NOTE — ED Triage Notes (Signed)
Patient in today w/ c/o of lower back pain that she describes as a "sparkler" that radiates into her upper and lower extremities. Sx onset was approx. 2 days ago. Also, a rash which is spreading to her extremities from her torso x 3 days. She has tried Benadryl w/ no relief.

## 2020-02-11 NOTE — Discharge Instructions (Signed)
-  Take Benadryl for rash. Motrin and tylenol tonight for pain -Begin prednisone tomorrow

## 2020-02-11 NOTE — ED Provider Notes (Signed)
[facility]    CSN: [id number] Arrival date & time: 02/11/20  1909      History   Chief Complaint Chief Complaint  Patient presents with  . Rash  . Back Pain    HPI Sara Campbell is a 19 y.o. female.   19 y/o female presents for rash of bilateral thighs, back, chest, stomach x 3 days. She also says she has had this strange mild/lower back pain that radiates up the back and to her finger and toes over the past 3 days. She says the feeling is like a "sparkler." Denies any allergies. The rash is very itchy. She has been taking Benadryl over the past couple of days and believe the rash and itching have improved. She says she is having sporadic back pain now of the upper lumbar back. Pain is "sharp and hot." Denies worsening of pain with movements. She does report that she recently did a lot of repetitive bending and lifting recently which may or may not be related to the back pain. Denies numbness/weakness/tingling, saddle anesthesia, loss of bowel/bladder control, dysuria, vaginal discharge. No other concerns today.     Past Medical History:  Diagnosis Date  . Chlamydia infection 2018  . Migraine without aura   . Miscarriage 09/09/2018  . Ovarian cyst     Patient Active Problem List   Diagnosis Date Noted  . SAB (spontaneous abortion) 09/26/2018  . Contraception management 09/26/2018  . Migraine without aura 09/13/2018    Past Surgical History:  Procedure Laterality Date  . WISDOM TOOTH EXTRACTION      OB History    Gravida  1   Para      Term      Preterm      AB  1   Living        SAB  1   TAB      Ectopic      Multiple      Live Births               Home Medications    Prior to Admission medications   Medication Sig Start Date End Date Taking? Authorizing Provider  Adapalene-Benzoyl Peroxide (EPIDUO FORTE) 0.3-2.5 % GEL Apply topically. 11/25/18  Yes [provider]  EPIDUO FORTE 0.3-2.5 % GEL Apply 1 application  topically daily. 11/25/18  Yes [provider]  levonorgestrel (MIRENA) 20 MCG/24HR IUD 1 Intra Uterine Device (1 each total) by Intrauterine route once for 1 dose. 05/27/19 02/11/20 Yes Conard Novak, MD  spironolactone (ALDACTONE) 50 MG tablet Take 50 mg by mouth 2 (two) times daily. 03/24/19  Yes [provider]  Levonorgestrel-Ethinyl Estradiol (AMETHIA) 0.15-0.03 &0.01 MG tablet Take 1 tablet by mouth daily. Patient not taking: Reported on 11/13/2019 02/20/19   Farrel Conners, CNM  levonorgestrel-ethinyl estradiol (SEASONALE) 0.15-0.03 MG tablet Take by mouth.    [provider]  medroxyPROGESTERone (PROVERA) 10 MG tablet Take 1 tablet (10 mg total) by mouth 3 (three) times daily for 7 days. 05/18/19 05/25/19  Willy Eddy, MD  methylPREDNISolone (MEDROL DOSEPAK) 4 MG TBPK tablet Take according to dose pack instructions x 6 days 02/11/20   Eusebio Friendly B, PA-C  Norethindrone Acetate-Ethinyl Estrad-FE (JUNEL FE 24) 1-20 MG-MCG(24) tablet Take 1 tablet by mouth daily. Patient not taking: Reported on 11/13/2019 09/26/18   Farrel Conners, CNM    Family History Family History  Problem Relation Age of Onset  . Hypertension Mother   . Hypertension Father   .  Breast cancer Maternal Grandmother 19    Social History Social History   Tobacco Use  . Smoking status: Never Smoker  . Smokeless tobacco: Never Used  Vaping Use  . Vaping Use: Never used  Substance Use Topics  . Alcohol use: No  . Drug use: Never     Allergies   Patient has no known allergies.   Review of Systems Review of Systems  Constitutional: Negative for chills, diaphoresis, fatigue and fever.  HENT: Negative for congestion, ear pain, rhinorrhea, sinus pressure, sinus pain and sore throat.   Respiratory: Negative for cough and shortness of breath.   Cardiovascular: Negative for chest pain.  Gastrointestinal: Negative for abdominal pain, nausea and vomiting.  Genitourinary:  Negative for difficulty urinating, hematuria and vaginal discharge.  Musculoskeletal: Positive for back pain. Negative for arthralgias and myalgias.  Skin: Positive for rash.  Neurological: Negative for weakness and headaches.  Hematological: Negative for adenopathy.      Physical Exam Triage Vital Signs ED Triage Vitals  Enc Vitals Group     BP 02/11/20 1947 127/85     Pulse Rate 02/11/20 1947 69     Resp 02/11/20 1947 16     Temp 02/11/20 1947 98.5 F (36.9 C)     Temp Source 02/11/20 1947 Oral     SpO2 02/11/20 1947 100 %     Weight 02/11/20 1950 113 lb (51.3 kg)     Height 02/11/20 1950 5\' 3"  (1.6 m)     Head Circumference --      Peak Flow --      Pain Score 02/11/20 1949 8     Pain Loc --      Pain Edu? --      Excl. in GC? --    No data found.  Updated Vital Signs BP 127/85 (BP Location: Right Arm)   Pulse 69   Temp 98.5 F (36.9 C) (Oral)   Resp 16   Ht 5\' 3"  (1.6 m)   Wt 113 lb (51.3 kg)   LMP 02/11/2020 (Exact Date)   SpO2 100%   BMI 20.02 kg/m    Physical Exam Vitals and nursing note reviewed.  Constitutional:      General: She is not in acute distress.    Appearance: Normal appearance. She is not ill-appearing or toxic-appearing.  HENT:     Head: Normocephalic and atraumatic.     Nose: Nose normal.     Mouth/Throat:     Mouth: Mucous membranes are moist.     Pharynx: Oropharynx is clear.  Eyes:     General: No scleral icterus.       Right eye: No discharge.        Left eye: No discharge.     Conjunctiva/sclera: Conjunctivae normal.  Cardiovascular:     Rate and Rhythm: Normal rate and regular rhythm.     Heart sounds: Normal heart sounds.  Pulmonary:     Effort: Pulmonary effort is normal. No respiratory distress.     Breath sounds: Normal breath sounds.  Abdominal:     Palpations: Abdomen is soft.     Tenderness: There is no abdominal tenderness.  Musculoskeletal:     Cervical back: Neck supple.     Lumbar back: Tenderness (diffuse  mild TTP lumbar region) present. No bony tenderness. Normal range of motion. Negative right straight leg raise test and negative left straight leg raise test.  Skin:    General: Skin is dry.     Findings:  Rash present. Rash is papular (fine, faint pink papular rash of back, chest, abdomen, bilateral thighs).  Neurological:     General: No focal deficit present.     Mental Status: She is alert. Mental status is at baseline.     Motor: No weakness.     Gait: Gait normal.  Psychiatric:        Mood and Affect: Mood normal.        Behavior: Behavior normal.        Thought Content: Thought content normal.      UC Treatments / Results  Labs (all labs ordered are listed, but only abnormal results are displayed) Labs Reviewed - No data to display  EKG   Radiology No results found.  Procedures Procedures (including critical care time)  Medications Ordered in UC Medications - No data to display  Initial Impression / Assessment and Plan / UC Course  I have reviewed the triage vital signs and the nursing notes.  Pertinent labs & imaging results that were available during my care of the patient were reviewed by me and considered in my medical decision making (see chart for details).    19 y/o female presenting for back pain and rash. Based on exam and history, it seems unlikely that both symptoms are related. She has no fevers and no other symptoms at all. She has had some improvement with Benadryl so I have advised she continue this as she could be having allergic reaction/antihistamine response to unknown trigger. Advised her to begin prednisone as well for the possible reaction. Explained that her back pain seems musculoskeletal. She could have lumbar strain from her reported repeated bending recently. Advised that prednisone could also help the back pain. ED precautions discussed for the rash/reaction as well as for back pain.   Final Clinical Impressions(s) / UC Diagnoses   Final  diagnoses:  Rash and nonspecific skin eruption  Acute bilateral low back pain without sciatica     Discharge Instructions     -Take Benadryl for rash. Motrin and tylenol tonight for pain -Begin prednisone tomorrow    ED Prescriptions    Medication Sig Dispense Auth. Provider   methylPREDNISolone (MEDROL DOSEPAK) 4 MG TBPK tablet Take according to dose pack instructions x 6 days 21 tablet Shirlee Latch, PA-C     PDMP not reviewed this encounter.   Shirlee Latch, PA-C 02/12/20 2202

## 2020-04-17 ENCOUNTER — Ambulatory Visit
Admission: EM | Admit: 2020-04-17 | Discharge: 2020-04-17 | Disposition: A | Discharge status: Home / Self Care | Payer: PRIVATE HEALTH INSURANCE | Attending: Family Medicine | Admitting: Family Medicine

## 2020-04-17 ENCOUNTER — Other Ambulatory Visit: Payer: Self-pay

## 2020-04-17 ENCOUNTER — Encounter: Payer: Self-pay | Admitting: Emergency Medicine

## 2020-04-17 DIAGNOSIS — Z79899 Other long term (current) drug therapy: Secondary | ICD-10-CM | POA: Insufficient documentation

## 2020-04-17 DIAGNOSIS — Z793 Long term (current) use of hormonal contraceptives: Secondary | ICD-10-CM | POA: Diagnosis not present

## 2020-04-17 DIAGNOSIS — R059 Cough, unspecified: Secondary | ICD-10-CM | POA: Insufficient documentation

## 2020-04-17 DIAGNOSIS — B9789 Other viral agents as the cause of diseases classified elsewhere: Secondary | ICD-10-CM | POA: Insufficient documentation

## 2020-04-17 DIAGNOSIS — Z20822 Contact with and (suspected) exposure to covid-19: Secondary | ICD-10-CM | POA: Diagnosis not present

## 2020-04-17 DIAGNOSIS — J988 Other specified respiratory disorders: Secondary | ICD-10-CM | POA: Insufficient documentation

## 2020-04-17 MED ORDER — KETOROLAC TROMETHAMINE 10 MG PO TABS: 10.0000 mg | ORAL_TABLET | Freq: Four times a day (QID) | ORAL | 0 refills | Status: DC | PRN

## 2020-04-17 MED ORDER — BENZONATATE 200 MG PO CAPS: 200.0000 mg | ORAL_CAPSULE | Freq: Three times a day (TID) | ORAL | 0 refills | Status: DC | PRN

## 2020-04-17 NOTE — ED Triage Notes (Signed)
Patient c/o runny nose, cough, sore throat, bodyaches that started on Tuesday.  Patient denies fevers.

## 2020-04-17 NOTE — Discharge Instructions (Signed)
COVID test will be back tomorrow.  Stay home.  Medications as directed.  Take care  Dr. Adriana Simas

## 2020-04-17 NOTE — ED Provider Notes (Signed)
[facility]    CSN: [id number] Arrival date & time: 04/17/20  1536  History   Chief Complaint Chief Complaint  Patient presents with  . Cough  . Generalized Body Aches   HPI  19 year old female presents with respiratory symptoms.  Patient reports that her symptoms started on Monday.  She reports runny nose, cough, sore throat, body aches.  Subjective fever.  No documented elevated temperatures.  Reported sick contacts.  She has been going to work.  She is most bothered by the body aches.  No relieving factors.  No other associated symptoms.  No other complaints.  Past Medical History:  Diagnosis Date  . Chlamydia infection 2018  . Migraine without aura   . Miscarriage 09/09/2018  . Ovarian cyst     Patient Active Problem List   Diagnosis Date Noted  . SAB (spontaneous abortion) 09/26/2018  . Contraception management 09/26/2018  . Migraine without aura 09/13/2018    Past Surgical History:  Procedure Laterality Date  . WISDOM TOOTH EXTRACTION      OB History    Gravida  1   Para      Term      Preterm      AB  1   Living        SAB  1   TAB      Ectopic      Multiple      Live Births               Home Medications    Prior to Admission medications   Medication Sig Start Date End Date Taking? Authorizing Provider  Levonorgestrel-Ethinyl Estradiol (AMETHIA) 0.15-0.03 &0.01 MG tablet Take 1 tablet by mouth daily. 02/20/19  Yes Farrel Conners, CNM  Adapalene-Benzoyl Peroxide (EPIDUO FORTE) 0.3-2.5 % GEL Apply topically. 11/25/18   [provider]  benzonatate (TESSALON) 200 MG capsule Take 1 capsule (200 mg total) by mouth 3 (three) times daily as needed for cough. 04/17/20   Tymar Polyak G, DO  EPIDUO FORTE 0.3-2.5 % GEL Apply 1 application topically daily. 11/25/18   [provider]  ketorolac (TORADOL) 10 MG tablet Take 1 tablet (10 mg total) by mouth every 6 (six) hours as needed for moderate pain or severe pain.  04/17/20   Tommie Sams, DO  levonorgestrel (MIRENA) 20 MCG/24HR IUD 1 Intra Uterine Device (1 each total) by Intrauterine route once for 1 dose. 05/27/19 02/11/20  Conard Novak, MD  medroxyPROGESTERone (PROVERA) 10 MG tablet Take 1 tablet (10 mg total) by mouth 3 (three) times daily for 7 days. 05/18/19 05/25/19  Willy Eddy, MD  Norethindrone Acetate-Ethinyl Estrad-FE (JUNEL FE 24) 1-20 MG-MCG(24) tablet Take 1 tablet by mouth daily. Patient not taking: Reported on 11/13/2019 09/26/18 04/17/20  Farrel Conners, CNM  spironolactone (ALDACTONE) 50 MG tablet Take 50 mg by mouth 2 (two) times daily. 03/24/19 04/17/20  [provider]    Family History Family History  Problem Relation Age of Onset  . Hypertension Mother   . Hypertension Father   . Breast cancer Maternal Grandmother 39    Social History Social History   Tobacco Use  . Smoking status: Never Smoker  . Smokeless tobacco: Never Used  Vaping Use  . Vaping Use: Never used  Substance Use Topics  . Alcohol use: No  . Drug use: Never     Allergies   Patient has no known allergies.   Review of Systems Review of Systems Per HPI  Physical  Exam Triage Vital Signs ED Triage Vitals  Enc Vitals Group     BP 04/17/20 1552 115/86     Pulse Rate 04/17/20 1552 99     Resp 04/17/20 1552 14     Temp 04/17/20 1552 99.4 F (37.4 C)     Temp Source 04/17/20 1552 Oral     SpO2 04/17/20 1552 100 %     Weight 04/17/20 1548 113 lb (51.3 kg)     Height 04/17/20 1548 5\' 2"  (1.575 m)     Head Circumference --      Peak Flow --      Pain Score 04/17/20 1548 7     Pain Loc --      Pain Edu? --      Excl. in GC? --    Updated Vital Signs BP 115/86 (BP Location: Left Arm)   Pulse 99   Temp 99.4 F (37.4 C) (Oral)   Resp 14   Ht 5\' 2"  (1.575 m)   Wt 51.3 kg   SpO2 100%   BMI 20.67 kg/m   Visual Acuity Right Eye Distance:   Left Eye Distance:   Bilateral Distance:    Right Eye Near:   Left Eye  Near:    Bilateral Near:     Physical Exam Vitals and nursing note reviewed.  Constitutional:      General: She is not in acute distress.    Appearance: Normal appearance. She is not ill-appearing.  HENT:     Head: Normocephalic and atraumatic.  Eyes:     General:        Right eye: No discharge.        Left eye: No discharge.     Conjunctiva/sclera: Conjunctivae normal.  Cardiovascular:     Rate and Rhythm: Normal rate and regular rhythm.     Heart sounds: No murmur heard.   Pulmonary:     Effort: Pulmonary effort is normal.     Breath sounds: Normal breath sounds. No wheezing, rhonchi or rales.  Neurological:     Mental Status: She is alert.  Psychiatric:        Mood and Affect: Mood normal.        Behavior: Behavior normal.    UC Treatments / Results  Labs (all labs ordered are listed, but only abnormal results are displayed) Labs Reviewed  SARS CORONAVIRUS 2 (TAT 6-24 HRS)    EKG   Radiology No results found.  Procedures Procedures (including critical care time)  Medications Ordered in UC Medications - No data to display  Initial Impression / Assessment and Plan / UC Course  I have reviewed the triage vital signs and the nursing notes.  Pertinent labs & imaging results that were available during my care of the patient were reviewed by me and considered in my medical decision making (see chart for details).    19 year old female presents with a viral respiratory infection.  Concern for COVID-19.  Awaiting test results.  Toradol as needed for pain.  Tessalon Perles for cough.  Final Clinical Impressions(s) / UC Diagnoses   Final diagnoses:  Viral respiratory infection     Discharge Instructions     COVID test will be back tomorrow.  Stay home.  Medications as directed.  Take care  Dr.    ED Prescriptions    Medication Sig Dispense Auth. Provider   ketorolac (TORADOL) 10 MG tablet Take 1 tablet (10 mg total) by mouth every 6 (six)  hours as  needed for moderate pain or severe pain. 20 tablet Lorana Maffeo G, DO   benzonatate (TESSALON) 200 MG capsule Take 1 capsule (200 mg total) by mouth 3 (three) times daily as needed for cough. 30 capsule Tommie Sams, DO     PDMP not reviewed this encounter.   Tommie Sams, [place] 04/17/20 1642

## 2020-04-18 LAB — SARS CORONAVIRUS 2 (TAT 6-24 HRS): SARS Coronavirus 2: NEGATIVE

## 2020-04-22 ENCOUNTER — Ambulatory Visit (INDEPENDENT_AMBULATORY_CARE_PROVIDER_SITE_OTHER): Payer: No Typology Code available for payment source | Admitting: Obstetrics

## 2020-04-22 ENCOUNTER — Other Ambulatory Visit: Payer: Self-pay

## 2020-04-22 ENCOUNTER — Encounter: Payer: Self-pay | Admitting: Obstetrics

## 2020-04-22 ENCOUNTER — Other Ambulatory Visit (HOSPITAL_COMMUNITY)
Admission: RE | Admit: 2020-04-22 | Discharge: 2020-04-22 | Disposition: A | Discharge status: Home / Self Care | Payer: PRIVATE HEALTH INSURANCE | Source: Ambulatory Visit | Attending: Obstetrics | Admitting: Obstetrics

## 2020-04-22 VITALS — BP 110/80 | HR 60 | Ht 62.0 in | Wt 112.0 lb

## 2020-04-22 DIAGNOSIS — Z23 Encounter for immunization: Secondary | ICD-10-CM | POA: Diagnosis not present

## 2020-04-22 DIAGNOSIS — R5383 Other fatigue: Secondary | ICD-10-CM | POA: Diagnosis not present

## 2020-04-22 DIAGNOSIS — Z01419 Encounter for gynecological examination (general) (routine) without abnormal findings: Secondary | ICD-10-CM | POA: Diagnosis not present

## 2020-04-22 DIAGNOSIS — Z419 Encounter for procedure for purposes other than remedying health state, unspecified: Secondary | ICD-10-CM | POA: Diagnosis not present

## 2020-04-22 DIAGNOSIS — Z124 Encounter for screening for malignant neoplasm of cervix: Secondary | ICD-10-CM | POA: Diagnosis present

## 2020-04-22 DIAGNOSIS — A64 Unspecified sexually transmitted disease: Secondary | ICD-10-CM | POA: Diagnosis not present

## 2020-04-22 DIAGNOSIS — Z113 Encounter for screening for infections with a predominantly sexual mode of transmission: Secondary | ICD-10-CM

## 2020-04-22 LAB — POCT URINE PREGNANCY: Preg Test, Ur: NEGATIVE

## 2020-04-22 NOTE — Progress Notes (Signed)
Gynecology Annual Exam  PCP: Pa, [facility]  Chief Complaint:  Chief Complaint  Patient presents with  . Gynecologic Exam    ck for preg; hasn't been able to reach to feel for strings    History of Present Illness:  Ms. Sara Campbell is a 19 y.o. G1P0010 who LMP was Patient's last menstrual period was 01/25/2020 (exact date)., presents today for her annual examination.  Her menses are irregular since she had her IUD placed lasting 4 day(s).  Dysmenorrhea none. She does not have intermenstrual bleeding.  She is single partner, contraception - IUD.  Last Pap: unknown- NA  * Hx of STDs: chlamydia  There is a FH of breast cancer. ( her grandmother at age 65)There is no FH of ovarian cancer. The patient does do self-breast exams.  Tobacco use: The patient denies current or previous tobacco use. Alcohol use: none Exercise: not active    The patient wears seatbelts: yes.   The patient reports that domestic violence in her life is absent.   Past Medical History:  Diagnosis Date  . Chlamydia infection 2018  . Migraine without aura   . Miscarriage 09/09/2018  . Ovarian cyst     Past Surgical History:  Procedure Laterality Date  . WISDOM TOOTH EXTRACTION      Prior to Admission medications   Medication Sig Start Date End Date Taking? Authorizing Provider  Adapalene-Benzoyl Peroxide (EPIDUO FORTE) 0.3-2.5 % GEL Apply topically. 11/25/18  Yes [provider]  benzonatate (TESSALON) 200 MG capsule Take 1 capsule (200 mg total) by mouth 3 (three) times daily as needed for cough. Patient not taking: Reported on 04/22/2020 04/17/20   Tommie Sams, DO  ketorolac (TORADOL) 10 MG tablet Take 1 tablet (10 mg total) by mouth every 6 (six) hours as needed for moderate pain or severe pain. Patient not taking: Reported on 04/22/2020 04/17/20   Tommie Sams, DO  levonorgestrel (MIRENA) 20 MCG/24HR IUD 1 Intra Uterine Device (1 each total) by Intrauterine route once for 1 dose.  05/27/19 02/11/20  Conard Novak, MD  Norethindrone Acetate-Ethinyl Estrad-FE (JUNEL FE 24) 1-20 MG-MCG(24) tablet Take 1 tablet by mouth daily. Patient not taking: Reported on 11/13/2019 09/26/18 04/17/20  Farrel Conners, CNM  spironolactone (ALDACTONE) 50 MG tablet Take 50 mg by mouth 2 (two) times daily. 03/24/19 04/17/20  [provider]    No Known Allergies  Gynecologic History: Patient's last menstrual period was 01/25/2020 (exact date). History of abnormal pap smear: No History of STI: Yes   Obstetric History: G1P0010  Social History   Socioeconomic History  . Marital status: Single    Spouse name: Not on file  . Number of children: Not on file  . Years of education: Not on file  . Highest education level: Not on file  Occupational History  . Not on file  Tobacco Use  . Smoking status: Never Smoker  . Smokeless tobacco: Never Used  Vaping Use  . Vaping Use: Never used  Substance and Sexual Activity  . Alcohol use: No  . Drug use: Never  . Sexual activity: Yes    Birth control/protection: I.U.D.  Other Topics Concern  . Not on file  Social History Narrative  . Not on file   Social Determinants of Health   Financial Resource Strain:   . Difficulty of Paying Living Expenses: Not on file  Food Insecurity:   . Worried About Programme researcher, broadcasting/film/video in the Last Year: Not on file  .  Ran Out of Food in the Last Year: Not on file  Transportation Needs:   . Lack of Transportation (Medical): Not on file  . Lack of Transportation (Non-Medical): Not on file  Physical Activity:   . Days of Exercise per Week: Not on file  . Minutes of Exercise per Session: Not on file  Stress:   . Feeling of Stress : Not on file  Social Connections:   . Frequency of Communication with Friends and Family: Not on file  . Frequency of Social Gatherings with Friends and Family: Not on file  . Attends Religious Services: Not on file  . Active Member of Clubs or Organizations: Not  on file  . Attends Banker Meetings: Not on file  . Marital Status: Not on file  Intimate Partner Violence:   . Fear of Current or Ex-Partner: Not on file  . Emotionally Abused: Not on file  . Physically Abused: Not on file  . Sexually Abused: Not on file    Family History  Problem Relation Age of Onset  . Hypertension Mother   . Hypertension Father   . Breast cancer Maternal Grandmother 70    Review of Systems  Constitutional: Positive for malaise/fatigue.  HENT: Negative.   Eyes: Negative.   Respiratory: Negative.   Cardiovascular: Negative.   Gastrointestinal: Negative.   Genitourinary: Negative.   Musculoskeletal: Negative.   Skin: Negative.   Neurological: Negative.   Endo/Heme/Allergies: Negative.   Psychiatric/Behavioral: Negative.      Physical Exam BP 110/80   Pulse 60   Ht 5\' 2"  (1.575 m)   Wt 112 lb (50.8 kg)   LMP 01/25/2020 (Exact Date)   BMI 20.49 kg/m    Physical Exam Constitutional:      Appearance: Normal appearance. She is normal weight.  Genitourinary:     Vulva normal.     Genitourinary Comments: Shaves entire escutcheon  HENT:     Head: Normocephalic.     Nose: Nose normal.     Mouth/Throat:     Mouth: Mucous membranes are moist.     Pharynx: Oropharynx is clear.  Eyes:     Pupils: Pupils are equal, round, and reactive to light.  Cardiovascular:     Rate and Rhythm: Normal rate and regular rhythm.     Pulses: Normal pulses.     Heart sounds: Normal heart sounds.  Pulmonary:     Effort: Pulmonary effort is normal.     Breath sounds: Normal breath sounds.  Abdominal:     General: Abdomen is flat. Bowel sounds are normal.     Palpations: Abdomen is soft.  Musculoskeletal:        General: Normal range of motion.     Cervical back: Neck supple.  Neurological:     General: No focal deficit present.     Mental Status: She is alert and oriented to person, place, and time.  Skin:    General: Skin is warm and dry.   Psychiatric:        Mood and Affect: Mood normal.        Behavior: Behavior normal.     Female chaperone present for pelvic and breast  portions of the physical exam     Assessment: 19 y.o. G28P0010 female here for routine annual gynecologic examination recent fatigue IUD contraception  Plan: Problem List Items Addressed This Visit    None    Visit Diagnoses    Women's annual routine gynecological examination    -  Primary   Relevant Orders   Cytology - PAP   TSH   CBC With Differential   25-Hydroxy vitamin D Lcms D2+D3   HEP, RPR, HIV Panel   Cervical cancer screening       Relevant Orders   Cytology - PAP   Fatigue, unspecified type       Relevant Orders   TSH   CBC With Differential   25-Hydroxy vitamin D Lcms D2+D3   STI (sexually transmitted infection)       Routine screening for STI (sexually transmitted infection)       Relevant Orders   HEP, RPR, HIV Panel      Screening: -- Blood pressure screen normal -- Weight screening: normal -- Depression screening negative (PHQ-9) -- Nutrition: normal -- cholesterol screening: not due for screening -- osteoporosis screening: not due -- tobacco screening: not using -- alcohol screening: AUDIT questionnaire indicates low-risk usage. -- family history of breast cancer screening: done. not at high risk. -- no evidence of domestic violence or intimate partner violence. -- STD screening: gonorrhea/chlamydia NAAT collected -- pap smear collected per ASCCP guidelines -- flu vaccine declined -- HPV vaccination series: uncertain- she will ask her mother about whether she has had the series. For her reassurance , a urine pregnancy test is run- Negative result. RTC in one year. Thomasene Mohair, MD 04/22/2020 9:33 AM

## 2020-04-22 NOTE — Addendum Note (Signed)
Addended by: Loran Senters D on: 04/22/2020 10:07 AM   Modules accepted: Orders

## 2020-04-22 NOTE — Addendum Note (Signed)
Addended by: Loran Senters D on: 04/22/2020 10:04 AM   Modules accepted: Orders

## 2020-04-25 LAB — CYTOLOGY - PAP
Chlamydia: NEGATIVE
Comment: NEGATIVE
Comment: NEGATIVE
Comment: NEGATIVE
Comment: NORMAL
Diagnosis: NEGATIVE
High risk HPV: NEGATIVE
Neisseria Gonorrhea: NEGATIVE
Trichomonas: NEGATIVE

## 2020-04-26 ENCOUNTER — Encounter: Payer: Self-pay | Admitting: Obstetrics

## 2020-04-29 LAB — CBC WITH DIFFERENTIAL
Basophils Absolute: 0 10*3/uL (ref 0.0–0.2)
Basos: 0 %
EOS (ABSOLUTE): 0.1 10*3/uL (ref 0.0–0.4)
Eos: 1 %
Hematocrit: 44.3 % (ref 34.0–46.6)
Hemoglobin: 14.8 g/dL (ref 11.1–15.9)
Immature Grans (Abs): 0 10*3/uL (ref 0.0–0.1)
Immature Granulocytes: 0 %
Lymphocytes Absolute: 2.7 10*3/uL (ref 0.7–3.1)
Lymphs: 35 %
MCH: 30.1 pg (ref 26.6–33.0)
MCHC: 33.4 g/dL (ref 31.5–35.7)
MCV: 90 fL (ref 79–97)
Monocytes Absolute: 0.6 10*3/uL (ref 0.1–0.9)
Monocytes: 7 %
Neutrophils Absolute: 4.4 10*3/uL (ref 1.4–7.0)
Neutrophils: 57 %
RBC: 4.91 x10E6/uL (ref 3.77–5.28)
RDW: 12 % (ref 11.7–15.4)
WBC: 7.8 10*3/uL (ref 3.4–10.8)

## 2020-04-29 LAB — HEP, RPR, HIV PANEL
HIV Screen 4th Generation wRfx: NONREACTIVE
Hepatitis B Surface Ag: NEGATIVE
RPR Ser Ql: NONREACTIVE

## 2020-04-29 LAB — 25-HYDROXY VITAMIN D LCMS D2+D3
25-Hydroxy, Vitamin D-2: <1 ng/mL
25-Hydroxy, Vitamin D-3: 22 ng/mL
25-Hydroxy, Vitamin D: 22 ng/mL — ABNORMAL LOW

## 2020-04-29 LAB — TSH: TSH: 0.948 u[IU]/mL (ref 0.450–4.500)

## 2020-07-01 DIAGNOSIS — Z3202 Encounter for pregnancy test, result negative: Secondary | ICD-10-CM | POA: Diagnosis not present

## 2020-07-01 DIAGNOSIS — L700 Acne vulgaris: Secondary | ICD-10-CM | POA: Diagnosis not present

## 2020-07-06 DIAGNOSIS — Z20822 Contact with and (suspected) exposure to covid-19: Secondary | ICD-10-CM | POA: Diagnosis not present

## 2020-07-06 DIAGNOSIS — Z03818 Encounter for observation for suspected exposure to other biological agents ruled out: Secondary | ICD-10-CM | POA: Diagnosis not present

## 2020-08-01 DIAGNOSIS — L700 Acne vulgaris: Secondary | ICD-10-CM | POA: Diagnosis not present

## 2020-08-23 DIAGNOSIS — R399 Unspecified symptoms and signs involving the genitourinary system: Secondary | ICD-10-CM | POA: Diagnosis not present

## 2020-08-23 DIAGNOSIS — N390 Urinary tract infection, site not specified: Secondary | ICD-10-CM | POA: Diagnosis not present

## 2020-09-05 DIAGNOSIS — L700 Acne vulgaris: Secondary | ICD-10-CM | POA: Diagnosis not present

## 2020-10-10 DIAGNOSIS — L700 Acne vulgaris: Secondary | ICD-10-CM | POA: Diagnosis not present

## 2020-10-21 DIAGNOSIS — L700 Acne vulgaris: Secondary | ICD-10-CM | POA: Diagnosis not present

## 2020-11-14 DIAGNOSIS — L700 Acne vulgaris: Secondary | ICD-10-CM | POA: Diagnosis not present

## 2020-12-06 DIAGNOSIS — U071 COVID-19: Secondary | ICD-10-CM | POA: Diagnosis not present

## 2020-12-06 DIAGNOSIS — Z03818 Encounter for observation for suspected exposure to other biological agents ruled out: Secondary | ICD-10-CM | POA: Diagnosis not present

## 2020-12-19 DIAGNOSIS — L700 Acne vulgaris: Secondary | ICD-10-CM | POA: Diagnosis not present

## 2021-02-02 DIAGNOSIS — K130 Diseases of lips: Secondary | ICD-10-CM | POA: Diagnosis not present

## 2021-02-02 DIAGNOSIS — L700 Acne vulgaris: Secondary | ICD-10-CM | POA: Diagnosis not present

## 2021-02-16 ENCOUNTER — Emergency Department: Payer: BC Managed Care – PPO

## 2021-02-16 ENCOUNTER — Other Ambulatory Visit: Payer: Self-pay

## 2021-02-16 ENCOUNTER — Emergency Department
Admission: EM | Admit: 2021-02-16 | Discharge: 2021-02-16 | Disposition: A | Discharge status: Home / Self Care | Payer: BC Managed Care – PPO | Attending: Student in an Organized Health Care Education/Training Program | Admitting: Student in an Organized Health Care Education/Training Program

## 2021-02-16 ENCOUNTER — Encounter: Payer: Self-pay | Admitting: Emergency Medicine

## 2021-02-16 DIAGNOSIS — R102 Pelvic and perineal pain: Secondary | ICD-10-CM | POA: Diagnosis not present

## 2021-02-16 DIAGNOSIS — R1032 Left lower quadrant pain: Secondary | ICD-10-CM | POA: Diagnosis not present

## 2021-02-16 LAB — URINALYSIS, COMPLETE (UACMP) WITH MICROSCOPIC
Bacteria, UA: NONE SEEN
Bilirubin Urine: NEGATIVE
Glucose, UA: NEGATIVE mg/dL
Hgb urine dipstick: NEGATIVE
Ketones, ur: NEGATIVE mg/dL
Leukocytes,Ua: NEGATIVE
Nitrite: NEGATIVE
Protein, ur: NEGATIVE mg/dL
Specific Gravity, Urine: 1.023 (ref 1.005–1.030)
pH: 6 (ref 5.0–8.0)

## 2021-02-16 LAB — COMPREHENSIVE METABOLIC PANEL
ALT: 18 U/L (ref 0–44)
AST: 21 U/L (ref 15–41)
Albumin: 4.4 g/dL (ref 3.5–5.0)
Alkaline Phosphatase: 60 U/L (ref 38–126)
Anion gap: 5 (ref 5–15)
BUN: 9 mg/dL (ref 6–20)
CO2: 27 mmol/L (ref 22–32)
Calcium: 9.6 mg/dL (ref 8.9–10.3)
Chloride: 107 mmol/L (ref 98–111)
Creatinine, Ser: 0.64 mg/dL (ref 0.44–1.00)
GFR, Estimated: >60 mL/min (ref >60)
Glucose, Bld: 96 mg/dL (ref 70–99)
Potassium: 3.7 mmol/L (ref 3.5–5.1)
Sodium: 139 mmol/L (ref 135–145)
Total Bilirubin: 0.6 mg/dL (ref 0.3–1.2)
Total Protein: 7.4 g/dL (ref 6.5–8.1)

## 2021-02-16 LAB — CBC
HCT: 40.8 % (ref 36.0–46.0)
Hemoglobin: 14.2 g/dL (ref 12.0–15.0)
MCH: 30.5 pg (ref 26.0–34.0)
MCHC: 34.8 g/dL (ref 30.0–36.0)
MCV: 87.6 fL (ref 80.0–100.0)
Platelets: 205 10*3/uL (ref 150–400)
RBC: 4.66 MIL/uL (ref 3.87–5.11)
RDW: 12.8 % (ref 11.5–15.5)
WBC: 11.7 10*3/uL — ABNORMAL HIGH (ref 4.0–10.5)
nRBC: 0 % (ref 0.0–0.2)

## 2021-02-16 LAB — LIPASE, BLOOD: Lipase: 28 U/L (ref 11–51)

## 2021-02-16 LAB — PREGNANCY, URINE: Preg Test, Ur: NEGATIVE

## 2021-02-16 MED ORDER — ACETAMINOPHEN 325 MG PO TABS
650.0000 mg | ORAL_TABLET | Freq: Once | ORAL | Status: AC
  Administered 2021-02-16: 650 mg via ORAL
  Filled 2021-02-16: qty 2

## 2021-02-16 MED ORDER — MELOXICAM 15 MG PO TABS: 15.0000 mg | ORAL_TABLET | Freq: Every day | ORAL | 0 refills | Status: DC

## 2021-02-16 MED ORDER — OXYCODONE-ACETAMINOPHEN 5-325 MG PO TABS
1.0000 | ORAL_TABLET | Freq: Once | ORAL | Status: AC
  Administered 2021-02-16: 1 via ORAL
  Filled 2021-02-16: qty 1

## 2021-02-16 MED ORDER — KETOROLAC TROMETHAMINE 60 MG/2ML IM SOLN
30.0000 mg | Freq: Once | INTRAMUSCULAR | Status: AC
  Administered 2021-02-16: 30 mg via INTRAMUSCULAR
  Filled 2021-02-16: qty 2

## 2021-02-16 NOTE — Discharge Instructions (Addendum)
Please keep your appointment as scheduled with [facility] and regarding your pelvic pain.  Return to the emergency department if you experience any fevers, nausea or vomiting or worsening of your pain.  Otherwise, follow-up outpatient.  You have been prescribed Mobic to take once daily, which can safely be combined with Tylenol up to 1000 mg 4 times daily as needed.

## 2021-02-16 NOTE — ED Triage Notes (Signed)
Pt via POV from home. Pt c/o LLQ abd pain and flank pain since last week. Pt does have a hx of ovarian cyst rupture. Pt does have and IUD. Denies NVD. Denies fevers. Denies urinary symptoms. Pt is A&Ox4 and NAD.

## 2021-02-16 NOTE — ED Provider Notes (Signed)
[facility] Emergency Department Provider Note  ____________________________________________   Event Date/Time   First MD Initiated Contact with Patient 02/16/21 1546     (approximate)  I have reviewed the triage vital signs and the nursing notes.   HISTORY  Chief Complaint Abdominal Pain and Flank Pain  HPI Sara Campbell is a 20 y.o. female who presents to the emergency department for evaluation of left lower quadrant abdominal pain and flank pain.  She states that her first occurrence was last week, and that this has occurred intermittently but became much more severe today.  She does report history of an ovarian cyst that ruptured that cause similar pain.  She has an IUD in place, denies any changes to vaginal discharge, denies any concern for possible STD.  She denies any nausea vomiting or diarrhea, fevers or urinary symptoms.  She does not have menstrual cycles with her IUD.         Past Medical History:  Diagnosis Date   Chlamydia infection 2018   Migraine without aura    Miscarriage 09/09/2018   Ovarian cyst     Patient Active Problem List   Diagnosis Date Noted   SAB (spontaneous abortion) 09/26/2018   Contraception management 09/26/2018   Migraine without aura 09/13/2018    Past Surgical History:  Procedure Laterality Date   WISDOM TOOTH EXTRACTION      Prior to Admission medications   Medication Sig Start Date End Date Taking? Authorizing Provider  meloxicam (MOBIC) 15 MG tablet Take 1 tablet (15 mg total) by mouth daily for 15 days. 02/16/21 03/03/21 Yes Lyrik Buresh, Ruben Gottron, PA  Adapalene-Benzoyl Peroxide (EPIDUO FORTE) 0.3-2.5 % GEL Apply topically. 11/25/18   [provider]  benzonatate (TESSALON) 200 MG capsule Take 1 capsule (200 mg total) by mouth 3 (three) times daily as needed for cough. Patient not taking: Reported on 04/22/2020 04/17/20   Tommie Sams, DO  ketorolac (TORADOL) 10 MG tablet Take 1 tablet (10 mg total)  by mouth every 6 (six) hours as needed for moderate pain or severe pain. Patient not taking: Reported on 04/22/2020 04/17/20   Tommie Sams, DO  levonorgestrel (MIRENA) 20 MCG/24HR IUD 1 Intra Uterine Device (1 each total) by Intrauterine route once for 1 dose. 05/27/19 02/11/20  Conard Novak, MD  Norethindrone Acetate-Ethinyl Estrad-FE (JUNEL FE 24) 1-20 MG-MCG(24) tablet Take 1 tablet by mouth daily. Patient not taking: Reported on 11/13/2019 09/26/18 04/17/20  Farrel Conners, CNM  spironolactone (ALDACTONE) 50 MG tablet Take 50 mg by mouth 2 (two) times daily. 03/24/19 04/17/20  [provider]    Allergies Patient has no known allergies.  Family History  Problem Relation Age of Onset   Hypertension Mother    Hypertension Father    Breast cancer Maternal Grandmother 71    Social History Social History   Tobacco Use   Smoking status: Never   Smokeless tobacco: Never  Vaping Use   Vaping Use: Never used  Substance Use Topics   Alcohol use: No   Drug use: Never    Review of Systems Constitutional: No fever/chills Eyes: No visual changes. ENT: No sore throat. Cardiovascular: Denies chest pain. Respiratory: Denies shortness of breath. Gastrointestinal: + abdominal pain.  No nausea, no vomiting.  No diarrhea.  No constipation. Genitourinary: Negative for dysuria. Musculoskeletal: Negative for back pain. Skin: Negative for rash. Neurological: Negative for headaches, focal weakness or numbness.   ____________________________________________   PHYSICAL EXAM:  VITAL SIGNS: ED Triage  Vitals  Enc Vitals Group     BP 02/16/21 1441 (!) 131/98     Pulse Rate 02/16/21 1441 84     Resp 02/16/21 1441 18     Temp 02/16/21 1441 98.4 F (36.9 C)     Temp Source 02/16/21 1441 Oral     SpO2 02/16/21 1441 100 %     Weight 02/16/21 1442 116 lb (52.6 kg)     Height 02/16/21 1442 5\' 2"  (1.575 m)     Head Circumference --      Peak Flow --      Pain Score 02/16/21  1442 9     Pain Loc --      Pain Edu? --      Excl. in GC? --    Constitutional: Alert and oriented. Well appearing and in no acute distress. Eyes: Conjunctivae are normal. PERRL. EOMI. Head: Atraumatic. Nose: No congestion/rhinnorhea. Mouth/Throat: Mucous membranes are moist.  Oropharynx non-erythematous. Neck: No stridor.   Cardiovascular: Normal rate, regular rhythm. Grossly normal heart sounds.  Good peripheral circulation. Respiratory: Normal respiratory effort.  No retractions. Lungs CTAB. Gastrointestinal: Soft with tenderness to the left lower quadrant with mild guarding.  No tenderness to the remainder quadrants, no suprapubic tenderness.  No distention. No abdominal bruits. + Left-sided CVA tenderness, no right-sided tenderness. Genitourinary: Deferred Musculoskeletal: No lower extremity tenderness nor edema.  No joint effusions. Neurologic:  Normal speech and language. No gross focal neurologic deficits are appreciated. No gait instability. Skin:  Skin is warm, dry and intact. No rash noted. Psychiatric: Mood and affect are normal. Speech and behavior are normal.  ____________________________________________   LABS (all labs ordered are listed, but only abnormal results are displayed)  Labs Reviewed  CBC - Abnormal; Notable for the following components:      Result Value   WBC 11.7 (*)    All other components within normal limits  URINALYSIS, COMPLETE (UACMP) WITH MICROSCOPIC - Abnormal; Notable for the following components:   Color, Urine YELLOW (*)    APPearance CLEAR (*)    All other components within normal limits  LIPASE, BLOOD  COMPREHENSIVE METABOLIC PANEL  PREGNANCY, URINE    ____________________________________________  RADIOLOGY  Official radiology report(s): CT Renal Stone Study  Result Date: 02/16/2021 CLINICAL DATA:  Left-sided flank pain for several weeks, initial encounter EXAM: CT ABDOMEN AND PELVIS WITHOUT CONTRAST TECHNIQUE: Multidetector CT  imaging of the abdomen and pelvis was performed following the standard protocol without IV contrast. COMPARISON:  04/28/2013 FINDINGS: Lower chest: No acute abnormality. Hepatobiliary: No focal liver abnormality is seen. No gallstones, gallbladder wall thickening, or biliary dilatation. Pancreas: Unremarkable. No pancreatic ductal dilatation or surrounding inflammatory changes. Spleen: Normal in size without focal abnormality. Adrenals/Urinary Tract: Adrenal glands are within normal limits. Kidneys are well visualized bilaterally. Punctate nonobstructing renal calculi are noted bilaterally. No obstructive changes are seen. The ureters are within normal limits. The bladder is decompressed. Stomach/Bowel: Colon shows no obstructive or inflammatory changes. The appendix is within normal limits. Small bowel and stomach are unremarkable. Vascular/Lymphatic: No significant vascular findings are present. No enlarged abdominal or pelvic lymph nodes. Reproductive: IUD is noted within the uterus. Uterus and adnexa appear within normal limits. Other: No abdominal wall hernia or abnormality. No abdominopelvic ascites. Musculoskeletal: No acute or significant osseous findings. IMPRESSION: Punctate nonobstructing renal calculi bilaterally. No other focal abnormality is noted. Electronically Signed   By: 13/09/2012 M.D.   On: 02/16/2021 19:54   02/18/2021 PELVIC COMPLETE W TRANSVAGINAL  AND TORSION R/O  Result Date: 02/16/2021 CLINICAL DATA:  Left lower quadrant abdominal pain EXAM: TRANSABDOMINAL AND TRANSVAGINAL ULTRASOUND OF PELVIS DOPPLER ULTRASOUND OF OVARIES TECHNIQUE: Both transabdominal and transvaginal ultrasound examinations of the pelvis were performed. Transabdominal technique was performed for global imaging of the pelvis including uterus, ovaries, adnexal regions, and pelvic cul-de-sac. It was necessary to proceed with endovaginal exam following the transabdominal exam to visualize the endometrium and ovaries  bilaterally. Color and duplex Doppler ultrasound was utilized to evaluate blood flow to the ovaries. COMPARISON:  None. FINDINGS: Uterus Measurements: 6.4 x 3.4 x 4.6 cm = volume: 52 mL. No fibroids or other mass visualized. The cervix is unremarkable. Endometrium Thickness: 4 mm. Intrauterine device is in expected position within the endometrial cavity. Right ovary Measurements: 2.6 x 1.3 x 2.1 cm = volume: 4 mL. Normal appearance/no adnexal mass. Left ovary Measurements: 3.4 x 1.8 x 2.1 cm = volume: 7 mL. Normal appearance/no adnexal mass. Pulsed Doppler evaluation of both ovaries demonstrates normal low-resistance arterial and venous waveforms. Other findings No abnormal free fluid. IMPRESSION: Intrauterine device in expected position. Otherwise unremarkable examination. Electronically Signed   By: Helyn NumbersAshesh  Parikh M.D.   On: 02/16/2021 19:20     ____________________________________________   INITIAL IMPRESSION / ASSESSMENT AND PLAN / ED COURSE  As part of my medical decision making, I reviewed the following data within the electronic MEDICAL RECORD NUMBER Nursing notes reviewed and incorporated, Labs reviewed, and Notes from prior ED visits        Patient is a 20 year old female who presents to the emergency department for evaluation of left lower quadrant abdominal pain radiating into the left flank.  See HPI for further details.  Differentials considered include PID, ovarian cyst, ovarian torsion, colitis, renal stone, UTI, pyelonephritis  In triage patient has grossly normal vital signs.  Physical exam as above, notably with left-sided CVA tenderness as well as left lower quadrant tenderness.  CBC with a mild leukocytosis of 11.7, CMP and lipase within normal limits.  Urinalysis clean, pregnancy negative.  Ultrasound was obtained to rule out ovarian cyst or torsion and is grossly within normal limits.  IUD has appropriate placement with no migration.  Given the presence of flank pain as well, CT  renal stone study was obtained and is negative for any acute stone in the ureter.  There are punctate bilateral stones, raising the question for possibly a recently passed stone.   Notably, the patient states no changes in vaginal discharge, fever or concern for any STDs.  She deferred pelvic exam at this time given low suspicion for pelvic infection.  Patient has improved with dose of Percocet here.  Discussed the findings with the patient, she already has OB/GYN follow-up scheduled.  We will treat with Toradol, Tylenol and have the patient follow-up closely on outpatient basis.  Return precautions were discussed at length, particularly for any worsening pain, nausea or vomiting or fever.  The patient is amenable with this plan, stable this time for outpatient follow-up.      ____________________________________________   FINAL CLINICAL IMPRESSION(S) / ED DIAGNOSES  Final diagnoses:  LLQ pain  Pelvic pain     ED Discharge Orders          Ordered    meloxicam (MOBIC) 15 MG tablet  Daily        02/16/21 2003             Note:  This document was prepared using Dragon voice recognition software and may  include unintentional dictation errors.    Lucy Chris, PA 02/16/21 2046    Willy Eddy, MD 02/20/21 [phone]

## 2021-02-24 ENCOUNTER — Other Ambulatory Visit: Payer: Self-pay

## 2021-02-24 ENCOUNTER — Encounter: Payer: Self-pay | Admitting: Advanced Practice Midwife

## 2021-02-24 ENCOUNTER — Ambulatory Visit (INDEPENDENT_AMBULATORY_CARE_PROVIDER_SITE_OTHER): Payer: BC Managed Care – PPO | Admitting: Advanced Practice Midwife

## 2021-02-24 VITALS — BP 100/76 | Ht 63.0 in | Wt 116.0 lb

## 2021-02-24 DIAGNOSIS — Z30431 Encounter for routine checking of intrauterine contraceptive device: Secondary | ICD-10-CM | POA: Diagnosis not present

## 2021-02-24 DIAGNOSIS — R5383 Other fatigue: Secondary | ICD-10-CM | POA: Diagnosis not present

## 2021-02-25 LAB — TSH: TSH: 0.975 u[IU]/mL (ref 0.450–4.500)

## 2021-02-25 NOTE — Progress Notes (Signed)
Patient ID: Sara Campbell, female   DOB: 2001/02/04, 20 y.o.   MRN: [id number]  Reason for Consult: IUD Check (Was having sharp left side pelvic pain within the last two weeks, denies UTI sx)    Subjective:  Date of Service: 02/24/2021  HPI:  Sara Campbell is a 20 y.o. female being seen following an ER visit for left side pelvic and flank pain. Findings in ER suggest possible recent passage of kidney stone and otherwise unremarkable. IUD noted to be in correct position in the uterus. Her pain is improved at the time of this visit. She mentions ongoing fatigue in the past few months despite adequate sleep. She has little to no bleeding due to IUD. CBC normal in ER. She requests IUD string check and TSH lab today.   Past Medical History:  Diagnosis Date   Chlamydia infection 2018   Migraine without aura    Miscarriage 09/09/2018   Ovarian cyst    Family History  Problem Relation Age of Onset   Hypertension Mother    Hypertension Father    Breast cancer Maternal Grandmother 61   Past Surgical History:  Procedure Laterality Date   WISDOM TOOTH EXTRACTION      Short Social History:  Social History   Tobacco Use   Smoking status: Never   Smokeless tobacco: Never  Substance Use Topics   Alcohol use: No    No Known Allergies  Current Outpatient Medications  Medication Sig Dispense Refill   levonorgestrel (MIRENA) 20 MCG/24HR IUD 1 Intra Uterine Device (1 each total) by Intrauterine route once for 1 dose. 1 each 0   No current facility-administered medications for this visit.   Review of Systems  Constitutional:  Positive for malaise/fatigue. Negative for chills and fever.  HENT:  Negative for congestion, ear discharge, ear pain, hearing loss, sinus pain and sore throat.   Eyes:  Negative for blurred vision and double vision.  Respiratory:  Negative for cough, shortness of breath and wheezing.   Cardiovascular:  Negative for chest pain, palpitations and leg swelling.   Gastrointestinal:  Negative for abdominal pain, blood in stool, constipation, diarrhea, heartburn, melena, nausea and vomiting.  Genitourinary:  Negative for dysuria, flank pain, frequency, hematuria and urgency.  Musculoskeletal:  Negative for back pain, joint pain and myalgias.  Skin:  Negative for itching and rash.  Neurological:  Negative for dizziness, tingling, tremors, sensory change, speech change, focal weakness, seizures, loss of consciousness, weakness and headaches.  Endo/Heme/Allergies:  Negative for environmental allergies. Does not bruise/bleed easily.  Psychiatric/Behavioral:  Negative for depression, hallucinations, memory loss, substance abuse and suicidal ideas. The patient is not nervous/anxious and does not have insomnia.        Objective:  Objective   Vitals:   02/24/21 1412  BP: 100/76  Weight: 116 lb (52.6 kg)  Height: 5\' 3"  (1.6 m)   Body mass index is 20.55 kg/m. Constitutional: Well nourished, well developed female in no acute distress.  HEENT: normal Skin: Warm and dry.  Extremity: no edema Respiratory:  Normal respiratory effort Abdomen: soft, nontender, nondistended, no abnormal masses, no epigastric pain Back: no CVAT Neuro: DTRs 2+, Cranial nerves grossly intact Psych: Alert and Oriented x3. No memory deficits. Normal mood and affect.  MS: normal gait, normal bilateral lower extremity ROM/strength/stability.  Pelvic exam:  is not limited by body habitus EGBUS: within normal limits Vagina: within normal limits and with normal mucosa  Cervix: IUD strings visible, normal length   Assessment/Plan:  20 y.o. G1 P0 with fatigue, IUD strings normal length  TSH drawn Follow up as needed after lab results  Update to visit: Normal TSH: 0.975   Tresea Mall CNM [facility] 02/25/2021, 2:36 PM

## 2021-03-09 DIAGNOSIS — L700 Acne vulgaris: Secondary | ICD-10-CM | POA: Diagnosis not present

## 2021-04-10 DIAGNOSIS — L700 Acne vulgaris: Secondary | ICD-10-CM | POA: Diagnosis not present

## 2021-08-11 ENCOUNTER — Encounter: Payer: Self-pay | Admitting: Advanced Practice Midwife

## 2021-08-11 ENCOUNTER — Ambulatory Visit (INDEPENDENT_AMBULATORY_CARE_PROVIDER_SITE_OTHER): Payer: BC Managed Care – PPO | Admitting: Advanced Practice Midwife

## 2021-08-11 ENCOUNTER — Other Ambulatory Visit: Payer: Self-pay

## 2021-08-11 VITALS — BP 120/80 | Ht 63.0 in | Wt 120.0 lb

## 2021-08-11 DIAGNOSIS — Z30431 Encounter for routine checking of intrauterine contraceptive device: Secondary | ICD-10-CM

## 2021-08-14 NOTE — Progress Notes (Addendum)
Obstetrics & Gynecology Office Visit  Date of Service: 08/11/2021  Chief Complaint:  Chief Complaint  Patient presents with   Contraception    Iud check     History of Present Illness: 21 y.o. patient being seen for IUD string check. Her Mirena IUD was placed in December of 2020. She does not have bleeding with IUD. In the past week she had an episode of nausea with dizziness. She wonders if there is a problem with the IUD and prefers to have [place] check the strings in the office.   Review of Systems: Review of Systems  Constitutional:  Negative for chills and fever.  HENT:  Negative for congestion, ear discharge, ear pain, hearing loss, sinus pain and sore throat.   Eyes:  Negative for blurred vision and double vision.  Respiratory:  Negative for cough, shortness of breath and wheezing.   Cardiovascular:  Negative for chest pain, palpitations and leg swelling.  Gastrointestinal:  Positive for nausea. Negative for abdominal pain, blood in stool, constipation, diarrhea, heartburn, melena and vomiting.  Genitourinary:  Negative for dysuria, flank pain, frequency, hematuria and urgency.  Musculoskeletal:  Negative for back pain, joint pain and myalgias.  Skin:  Negative for itching and rash.  Neurological:  Positive for dizziness. Negative for tingling, tremors, sensory change, speech change, focal weakness, seizures, loss of consciousness, weakness and headaches.  Endo/Heme/Allergies:  Negative for environmental allergies. Does not bruise/bleed easily.  Psychiatric/Behavioral:  Negative for depression, hallucinations, memory loss, substance abuse and suicidal ideas. The patient is not nervous/anxious and does not have insomnia.    Past Medical History:  Past Medical History:  Diagnosis Date   Chlamydia infection 2018   Migraine without aura    Miscarriage 09/09/2018   Ovarian cyst     Past Surgical History:  Past Surgical History:  Procedure Laterality Date   WISDOM TOOTH  EXTRACTION      Gynecologic History: No LMP recorded. (Menstrual status: IUD).  Obstetric History: G1P0010  Family History:  Family History  Problem Relation Age of Onset   Hypertension Mother    Hypertension Father    Breast cancer Maternal Grandmother 70    Social History:  Social History   Socioeconomic History   Marital status: Single    Spouse name: Not on file   Number of children: Not on file   Years of education: Not on file   Highest education level: Not on file  Occupational History   Not on file  Tobacco Use   Smoking status: Never   Smokeless tobacco: Never  Vaping Use   Vaping Use: Never used  Substance and Sexual Activity   Alcohol use: No   Drug use: Never   Sexual activity: Yes    Birth control/protection: I.U.D.    Comment: Mirena  Other Topics Concern   Not on file  Social History Narrative   Not on file   Social Determinants of Health   Financial Resource Strain: Not on file  Food Insecurity: Not on file  Transportation Needs: Not on file  Physical Activity: Not on file  Stress: Not on file  Social Connections: Not on file  Intimate Partner Violence: Not on file    Allergies:  No Known Allergies  Medications: Prior to Admission medications   Medication Sig Start Date End Date Taking? Authorizing Provider  levonorgestrel (MIRENA) 20 MCG/DAY IUD 1 each by Intrauterine route once. 05/27/19 05/27/27 Yes Will Bonnet, MD  Norethindrone Acetate-Ethinyl Estrad-FE (JUNEL FE 24)  1-20 MG-MCG(24) tablet Take 1 tablet by mouth daily. Patient not taking: Reported on 11/13/2019 09/26/18 04/17/20  Dalia Heading, CNM  spironolactone (ALDACTONE) 50 MG tablet Take 50 mg by mouth 2 (two) times daily. 03/24/19 04/17/20  [provider]    Physical Exam Blood pressure 120/80, height 5\' 3"  (1.6 m), weight 120 lb (54.4 kg). No LMP recorded. (Menstrual status: IUD).  General: NAD HEENT: normocephalic, anicteric Pulmonary: No increased  work of breathing  Genitourinary:  External: Normal external female genitalia.  Normal urethral meatus, normal  Bartholin's and Skene's glands.    Vagina: Normal vaginal mucosa, no evidence of prolapse.    Cervix: Grossly normal in appearance, no bleeding, IUD strings visualized 2cm  Uterus: Non-enlarged, mobile, normal contour.  No CMT  Adnexa: ovaries non-enlarged, no adnexal masses  Rectal: deferred  Lymphatic: no evidence of inguinal lymphadenopathy Extremities: no edema, erythema, or tenderness Neurologic: Grossly intact Psychiatric: mood appropriate, affect full   Assessment: 21 y.o. G1P0010 IUD in place   Plan: Problem List Items Addressed This Visit   None Visit Diagnoses     IUD check up    -  Primary       Check strings as able monthly Return to clinic as needed for problems/concerns and for annual exam A total of 15 minutes were spent in face-to-face contact with the patient during this encounter with over half of that time devoted to counseling and coordination of care.   Rod Can, [facility] 08/14/2021, 2:01 PM

## 2021-09-22 ENCOUNTER — Ambulatory Visit: Payer: BC Managed Care – PPO | Admitting: Obstetrics

## 2021-12-15 ENCOUNTER — Ambulatory Visit (INDEPENDENT_AMBULATORY_CARE_PROVIDER_SITE_OTHER): Payer: BC Managed Care – PPO | Admitting: Family

## 2021-12-15 ENCOUNTER — Encounter: Payer: Self-pay | Admitting: Family

## 2021-12-15 VITALS — BP 122/86 | HR 91 | Temp 98.6°F | Resp 16 | Ht 63.0 in | Wt 122.3 lb

## 2021-12-15 DIAGNOSIS — F411 Generalized anxiety disorder: Secondary | ICD-10-CM

## 2021-12-15 DIAGNOSIS — Z13220 Encounter for screening for lipoid disorders: Secondary | ICD-10-CM

## 2021-12-15 DIAGNOSIS — E559 Vitamin D deficiency, unspecified: Secondary | ICD-10-CM | POA: Insufficient documentation

## 2021-12-15 DIAGNOSIS — Z30430 Encounter for insertion of intrauterine contraceptive device: Secondary | ICD-10-CM

## 2021-12-15 DIAGNOSIS — F17290 Nicotine dependence, other tobacco product, uncomplicated: Secondary | ICD-10-CM | POA: Insufficient documentation

## 2021-12-15 DIAGNOSIS — G43109 Migraine with aura, not intractable, without status migrainosus: Secondary | ICD-10-CM | POA: Diagnosis not present

## 2021-12-15 DIAGNOSIS — D72829 Elevated white blood cell count, unspecified: Secondary | ICD-10-CM

## 2021-12-15 DIAGNOSIS — R5383 Other fatigue: Secondary | ICD-10-CM

## 2021-12-15 LAB — COMPREHENSIVE METABOLIC PANEL
ALT: 13 U/L (ref 0–35)
AST: 16 U/L (ref 0–37)
Albumin: 4.5 g/dL (ref 3.5–5.2)
Alkaline Phosphatase: 66 U/L (ref 39–117)
BUN: 12 mg/dL (ref 6–23)
CO2: 26 mEq/L (ref 19–32)
Calcium: 9.4 mg/dL (ref 8.4–10.5)
Chloride: 105 mEq/L (ref 96–112)
Creatinine, Ser: 0.78 mg/dL (ref 0.40–1.20)
GFR: 107.46 mL/min (ref >60.00)
Glucose, Bld: 78 mg/dL (ref 70–99)
Potassium: 4.4 mEq/L (ref 3.5–5.1)
Sodium: 138 mEq/L (ref 135–145)
Total Bilirubin: 0.4 mg/dL (ref 0.2–1.2)
Total Protein: 7.2 g/dL (ref 6.0–8.3)

## 2021-12-15 LAB — VITAMIN D 25 HYDROXY (VIT D DEFICIENCY, FRACTURES): VITD: 32.77 ng/mL (ref 30.00–100.00)

## 2021-12-15 LAB — CBC WITH DIFFERENTIAL/PLATELET
Basophils Absolute: 0 10*3/uL (ref 0.0–0.1)
Basophils Relative: 0.4 % (ref 0.0–3.0)
Eosinophils Absolute: 0.1 10*3/uL (ref 0.0–0.7)
Eosinophils Relative: 1.8 % (ref 0.0–5.0)
HCT: 44.2 % (ref 36.0–46.0)
Hemoglobin: 14.8 g/dL (ref 12.0–15.0)
Lymphocytes Relative: 31.4 % (ref 12.0–46.0)
Lymphs Abs: 2.3 10*3/uL (ref 0.7–4.0)
MCHC: 33.5 g/dL (ref 30.0–36.0)
MCV: 89.8 fl (ref 78.0–100.0)
Monocytes Absolute: 0.4 10*3/uL (ref 0.1–1.0)
Monocytes Relative: 5.9 % (ref 3.0–12.0)
Neutro Abs: 4.5 10*3/uL (ref 1.4–7.7)
Neutrophils Relative %: 60.5 % (ref 43.0–77.0)
Platelets: 184 10*3/uL (ref 150.0–400.0)
RBC: 4.93 Mil/uL (ref 3.87–5.11)
RDW: 13.2 % (ref 11.5–15.5)
WBC: 7.5 10*3/uL (ref 4.0–10.5)

## 2021-12-15 LAB — LIPID PANEL
Cholesterol: 129 mg/dL (ref 0–200)
HDL: 45 mg/dL (ref >39.00)
LDL Cholesterol: 75 mg/dL (ref 0–99)
NonHDL: 83.92
Total CHOL/HDL Ratio: 3
Triglycerides: 45 mg/dL (ref 0.0–149.0)
VLDL: 9 mg/dL (ref 0.0–40.0)

## 2021-12-15 LAB — TSH: TSH: 0.89 u[IU]/mL (ref 0.35–5.50)

## 2021-12-15 LAB — B12 AND FOLATE PANEL
Folate: 9 ng/mL (ref >5.9)
Vitamin B-12: 396 pg/mL (ref 211–911)

## 2021-12-15 LAB — SEDIMENTATION RATE: Sed Rate: 2 mm/hr (ref 0–20)

## 2021-12-15 LAB — C-REACTIVE PROTEIN: CRP: <1 mg/dL (ref 0.5–20.0)

## 2021-12-15 MED ORDER — HYDROXYZINE PAMOATE 25 MG PO CAPS: 25.0000 mg | ORAL_CAPSULE | Freq: Three times a day (TID) | ORAL | 0 refills | Status: AC | PRN

## 2021-12-15 MED ORDER — ESCITALOPRAM OXALATE 10 MG PO TABS
10.0000 mg | ORAL_TABLET | Freq: Every day | ORAL | 1 refills | Status: DC
Start: 2021-12-15 — End: 2022-01-08

## 2022-01-08 ENCOUNTER — Other Ambulatory Visit: Payer: Self-pay | Admitting: Family

## 2022-01-08 DIAGNOSIS — F411 Generalized anxiety disorder: Secondary | ICD-10-CM

## 2022-01-12 ENCOUNTER — Encounter: Payer: Self-pay | Admitting: Family

## 2022-01-12 ENCOUNTER — Ambulatory Visit: Payer: BC Managed Care – PPO | Admitting: Family

## 2022-01-12 DIAGNOSIS — F411 Generalized anxiety disorder: Secondary | ICD-10-CM

## 2022-01-12 MED ORDER — ESCITALOPRAM OXALATE 10 MG PO TABS: 10.0000 mg | ORAL_TABLET | Freq: Every day | ORAL | 1 refills | Status: DC

## 2022-03-01 ENCOUNTER — Other Ambulatory Visit: Payer: Self-pay

## 2022-03-01 ENCOUNTER — Encounter: Payer: Self-pay | Admitting: Emergency Medicine

## 2022-03-01 ENCOUNTER — Ambulatory Visit
Admission: EM | Admit: 2022-03-01 | Discharge: 2022-03-01 | Disposition: A | Discharge status: Home / Self Care | Payer: BC Managed Care – PPO | Attending: Emergency Medicine | Admitting: Emergency Medicine

## 2022-03-01 DIAGNOSIS — J0100 Acute maxillary sinusitis, unspecified: Secondary | ICD-10-CM

## 2022-03-01 MED ORDER — AMOXICILLIN-POT CLAVULANATE 875-125 MG PO TABS: 1.0000 | ORAL_TABLET | Freq: Two times a day (BID) | ORAL | 0 refills | Status: AC

## 2022-03-01 MED ORDER — FLUTICASONE PROPIONATE 50 MCG/ACT NA SUSP: 1.0000 | Freq: Two times a day (BID) | NASAL | 0 refills | Status: DC

## 2022-03-01 NOTE — ED Provider Notes (Signed)
[facility]    CSN: [id number] Arrival date & time: 03/01/22  1826      History   Chief Complaint Chief Complaint  Patient presents with   Facial Pain   Cough    HPI Sara Campbell is a 21 y.o. female.   Patient presents with nasal congestion, rhinorrhea, sinus headaches, sinus pain and pressure within the face, a productive cough beginning 7 days ago.  It has begun to radiate to the bilateral jaws.  Decreased appetite but tolerating some food and fluids.  No known sick contacts.  Has attempted use of sinus cold and flu, Mucinex, over-the-counter analgesics and additional medications which have provided no relief.  Endorses that symptoms are worsening day by day.  History of migraines.   Past Medical History:  Diagnosis Date   Chlamydia infection 2018   Migraine without aura    Miscarriage 09/09/2018   Ovarian cyst     Patient Active Problem List   Diagnosis Date Noted   Vitamin D deficiency 12/15/2021   Vaping nicotine dependence, tobacco product 12/15/2021   Leukocytosis 12/15/2021   Migraine with aura and without status migrainosus, not intractable 12/15/2021   Other fatigue 12/15/2021   Screening for lipoid disorders 12/15/2021   Anxiety state 12/15/2021   Contraception management 09/26/2018    Past Surgical History:  Procedure Laterality Date   WISDOM TOOTH EXTRACTION      OB History     Gravida  1   Para      Term      Preterm      AB  1   Living         SAB  1   IAB      Ectopic      Multiple      Live Births           Obstetric Comments  2020 sab           Home Medications    Prior to Admission medications   Medication Sig Start Date End Date Taking? Authorizing Provider  amoxicillin-clavulanate (AUGMENTIN) 875-125 MG tablet Take 1 tablet by mouth every 12 (twelve) hours for 10 days. 03/01/22 03/11/22 Yes Ann-Marie Kluge R, NP  fluticasone (FLONASE) 50 MCG/ACT nasal spray Place 1 spray into both nostrils 2 (two) times  daily. 03/01/22  Yes Roselynn Whitacre R, NP  escitalopram (LEXAPRO) 10 MG tablet Take 1 tablet (10 mg total) by mouth daily. 01/12/22   Mort Sawyers, FNP  levonorgestrel (MIRENA) 20 MCG/DAY IUD 1 each by Intrauterine route once. 05/27/19 05/27/27  Conard Novak, MD  Norethindrone Acetate-Ethinyl Estrad-FE (JUNEL FE 24) 1-20 MG-MCG(24) tablet Take 1 tablet by mouth daily. Patient not taking: Reported on 11/13/2019 09/26/18 04/17/20  Farrel Conners, CNM  spironolactone (ALDACTONE) 50 MG tablet Take 50 mg by mouth 2 (two) times daily. 03/24/19 04/17/20  [provider]    Family History Family History  Problem Relation Age of Onset   Hypertension Mother    Hypertension Father    Heart disease Father    Breast cancer Maternal Grandmother 35    Social History Social History   Tobacco Use   Smoking status: Never   Smokeless tobacco: Never  Vaping Use   Vaping Use: Every day  Substance Use Topics   Alcohol use: Yes    Comment: rare   Drug use: Never     Allergies   Patient has no known allergies.   Review of Systems Review of Systems Defer  to HPI   Physical Exam Triage Vital Signs ED Triage Vitals  Enc Vitals Group     BP 03/01/22 1847 (!) 138/101     Pulse Rate 03/01/22 1847 72     Resp 03/01/22 1848 18     Temp 03/01/22 1847 99.8 F (37.7 C)     Temp Source 03/01/22 1847 Oral     SpO2 03/01/22 1847 98 %     Weight --      Height --      Head Circumference --      Peak Flow --      Pain Score 03/01/22 1844 9     Pain Loc --      Pain Edu? --      Excl. in GC? --    No data found.  Updated Vital Signs BP (!) 138/101 (BP Location: Left Arm)   Pulse 72   Temp 99.8 F (37.7 C) (Oral)   Resp 18   LMP  (LMP Unknown)   SpO2 98%   Visual Acuity Right Eye Distance:   Left Eye Distance:   Bilateral Distance:    Right Eye Near:   Left Eye Near:    Bilateral Near:     Physical Exam Constitutional:      Appearance: Normal appearance.  HENT:      Head: Normocephalic.     Right Ear: Tympanic membrane, ear canal and external ear normal.     Left Ear: Tympanic membrane, ear canal and external ear normal.     Nose: Congestion and rhinorrhea present.     Right Turbinates: Swollen.     Left Turbinates: Swollen.     Right Sinus: Maxillary sinus tenderness present. No frontal sinus tenderness.     Left Sinus: Maxillary sinus tenderness present. No frontal sinus tenderness.     Mouth/Throat:     Mouth: Mucous membranes are moist.     Pharynx: Posterior oropharyngeal erythema present.     Tonsils: No tonsillar exudate. 0 on the right. 0 on the left.  Eyes:     Extraocular Movements: Extraocular movements intact.  Cardiovascular:     Rate and Rhythm: Normal rate and regular rhythm.     Pulses: Normal pulses.     Heart sounds: Normal heart sounds.  Pulmonary:     Effort: Pulmonary effort is normal.     Breath sounds: Normal breath sounds.  Skin:    General: Skin is warm and dry.  Neurological:     Mental Status: She is alert and oriented to person, place, and time. Mental status is at baseline.  Psychiatric:        Mood and Affect: Mood normal.        Behavior: Behavior normal.      UC Treatments / Results  Labs (all labs ordered are listed, but only abnormal results are displayed) Labs Reviewed - No data to display  EKG   Radiology No results found.  Procedures Procedures (including critical care time)  Medications Ordered in UC Medications - No data to display  Initial Impression / Assessment and Plan / UC Course  I have reviewed the triage vital signs and the nursing notes.  Pertinent labs & imaging results that were available during my care of the patient were reviewed by me and considered in my medical decision making (see chart for details).  Acute nonrecurrent maxillary sinusitis  Patient is in no signs of distress nor toxic appearing.  Vital signs are stable.  Low  suspicion for pneumonia, pneumothorax  or bronchitis and therefore will defer imaging.  Symptoms are consistent with a sinus infection as as they have been present for 7 days with no signs of resolution we will provide bacterial coverage  Prescribed a Augmentin and Flonase  May use additional over-the-counter medications as needed for supportive care.  May follow-up with urgent care as needed if symptoms persist or worsen.  Final Clinical Impressions(s) / UC Diagnoses   Final diagnoses:  Acute non-recurrent maxillary sinusitis     Discharge Instructions      Today you are being treated for sinus infection, typically these are caused by viruses but as your symptoms are progressing past 1 week with no signs of improvement we will provide bacterial coverage  Take Augmentin every morning and every evening for the next 10 days ideally you will begin to see improvement within the next 24 to 48 hours and steady progression from there  Begin use of Flonase every morning and every evening to help clear out the sinuses which will reduce some of your pressure  May continue any of the over-the-counter medications that you have been using for additional comfort, may also attempt any of the following below    You can take Tylenol and/or Ibuprofen as needed for fever reduction and pain relief.   For cough: honey 1/2 to 1 teaspoon (you can dilute the honey in water or another fluid).  You can also use guaifenesin and dextromethorphan for cough. You can use a humidifier for chest congestion and cough.  If you don't have a humidifier, you can sit in the bathroom with the hot shower running.      For sore throat: try warm salt water gargles, cepacol lozenges, throat spray, warm tea or water with lemon/honey, popsicles or ice, or OTC cold relief medicine for throat discomfort.   For congestion: take a daily anti-histamine like Zyrtec, Claritin, and a oral decongestant, such as pseudoephedrine.     It is important to stay hydrated: drink  plenty of fluids (water, gatorade/powerade/pedialyte, juices, or teas) to keep your throat moisturized and help further relieve irritation/discomfort.    ED Prescriptions     Medication Sig Dispense Auth. Provider   amoxicillin-clavulanate (AUGMENTIN) 875-125 MG tablet Take 1 tablet by mouth every 12 (twelve) hours for 10 days. 20 tablet Larri Brewton R, NP   fluticasone (FLONASE) 50 MCG/ACT nasal spray Place 1 spray into both nostrils 2 (two) times daily. 9.9 mL Valinda Hoar, NP      PDMP not reviewed this encounter.   Valinda Hoar, NP 03/01/22 1930

## 2022-03-01 NOTE — Discharge Instructions (Signed)
Today you are being treated for sinus infection, typically these are caused by viruses but as your symptoms are progressing past 1 week with no signs of improvement we will provide bacterial coverage  Take Augmentin every morning and every evening for the next 10 days ideally you will begin to see improvement within the next 24 to 48 hours and steady progression from there  Begin use of Flonase every morning and every evening to help clear out the sinuses which will reduce some of your pressure  May continue any of the over-the-counter medications that you have been using for additional comfort, may also attempt any of the following below    You can take Tylenol and/or Ibuprofen as needed for fever reduction and pain relief.   For cough: honey 1/2 to 1 teaspoon (you can dilute the honey in water or another fluid).  You can also use guaifenesin and dextromethorphan for cough. You can use a humidifier for chest congestion and cough.  If you don't have a humidifier, you can sit in the bathroom with the hot shower running.      For sore throat: try warm salt water gargles, cepacol lozenges, throat spray, warm tea or water with lemon/honey, popsicles or ice, or OTC cold relief medicine for throat discomfort.   For congestion: take a daily anti-histamine like Zyrtec, Claritin, and a oral decongestant, such as pseudoephedrine.     It is important to stay hydrated: drink plenty of fluids (water, gatorade/powerade/pedialyte, juices, or teas) to keep your throat moisturized and help further relieve irritation/discomfort.

## 2022-03-01 NOTE — ED Triage Notes (Signed)
Patient c/o sinus pressure and productive cough  x 7 days.   Patient denies fever.   Patient endorses jaw pain.   Patient has taken has taken OTC medications with no relief of symptoms.

## 2022-04-24 ENCOUNTER — Encounter: Payer: Self-pay | Admitting: Family

## 2022-05-15 ENCOUNTER — Encounter: Payer: Self-pay | Admitting: Internal Medicine

## 2022-05-15 ENCOUNTER — Ambulatory Visit (INDEPENDENT_AMBULATORY_CARE_PROVIDER_SITE_OTHER): Payer: 59 | Admitting: Internal Medicine

## 2022-05-15 ENCOUNTER — Encounter: Payer: Self-pay | Admitting: *Deleted

## 2022-05-15 VITALS — BP 110/70 | HR 72 | Temp 97.7°F | Ht 63.0 in | Wt 125.0 lb

## 2022-05-15 DIAGNOSIS — R221 Localized swelling, mass and lump, neck: Secondary | ICD-10-CM | POA: Diagnosis not present

## 2022-05-15 DIAGNOSIS — G43109 Migraine with aura, not intractable, without status migrainosus: Secondary | ICD-10-CM | POA: Diagnosis not present

## 2022-05-15 MED ORDER — SUMATRIPTAN SUCCINATE 50 MG PO TABS: 50.0000 mg | ORAL_TABLET | ORAL | 5 refills | Status: DC | PRN

## 2022-05-15 NOTE — Assessment & Plan Note (Signed)
Reassured--seems to be just a normal lymph node No action needed

## 2022-05-15 NOTE — Progress Notes (Signed)
   Subjective:    Patient ID: Sara Campbell, female    DOB: Nov 15, 2000, 21 y.o.   MRN: [id number]  HPI Here due to a lump on her neck  "It doesn't feel like it should be there" Noticed it last night Some tension in neck and shoulders --and has migraines She found it when feeling her neck due to tension Not tender  Has rice bag for heat--and that helps  Current Outpatient Medications on File Prior to Visit  Medication Sig Dispense Refill   levonorgestrel (MIRENA) 20 MCG/DAY IUD 1 each by Intrauterine route once.     [DISCONTINUED] Norethindrone Acetate-Ethinyl Estrad-FE (JUNEL FE 24) 1-20 MG-MCG(24) tablet Take 1 tablet by mouth daily. (Patient not taking: Reported on 11/13/2019) 1 Package 6   [DISCONTINUED] spironolactone (ALDACTONE) 50 MG tablet Take 50 mg by mouth 2 (two) times daily.     No current facility-administered medications on file prior to visit.    No Known Allergies  Past Medical History:  Diagnosis Date   Chlamydia infection 2018   Migraine without aura    Miscarriage 09/09/2018   Ovarian cyst     Past Surgical History:  Procedure Laterality Date   WISDOM TOOTH EXTRACTION      Family History  Problem Relation Age of Onset   Hypertension Mother    Hypertension Father    Heart disease Father    Breast cancer Maternal Grandmother 13    Social History   Socioeconomic History   Marital status: Single    Spouse name: Not on file   Number of children: Not on file   Years of education: Not on file   Highest education level: Not on file  Occupational History    Employer: [employer]  Tobacco Use   Smoking status: Never   Smokeless tobacco: Never  Vaping Use   Vaping Use: Every day  Substance and Sexual Activity   Alcohol use: Yes    Comment: rare   Drug use: Never   Sexual activity: Yes    Partners: Male    Birth control/protection: I.U.D.    Comment: Mirena  Other Topics Concern   Not on file  Social History Narrative   Not on file    Social Determinants of Health   Financial Resource Strain: Not on file  Food Insecurity: Not on file  Transportation Needs: Not on file  Physical Activity: Not on file  Stress: Not on file  Social Connections: Not on file  Intimate Partner Violence: Not on file   Review of Systems No problems with teeth No recent respiratory symptoms--mild runny nose (relates to the cold)     Objective:   Physical Exam Constitutional:      Appearance: Normal appearance.  Neck:     Comments: Took a while--but then there was ~1cm (or less) normal feeling gland along muscle in left neck No pathologic findings No true mass, cyst or other finding Musculoskeletal:     Cervical back: Normal range of motion and neck supple.  Neurological:     Mental Status: She is alert.            Assessment & Plan:

## 2022-05-15 NOTE — Assessment & Plan Note (Signed)
Has had as often as 3-4 days per week Has used prophylactic in the past--daily and weekly Doesn't remember any triptans Drinks caffeine--one soda in AM  Will Rx sumatriptan Headache referral

## 2022-05-22 ENCOUNTER — Ambulatory Visit: Payer: 59 | Admitting: Family

## 2022-11-30 IMAGING — US US PELVIS COMPLETE TRANSABD/TRANSVAG W DUPLEX
1 series · 13 of 25 positions shown · non-contrast
Comparison: None.

CLINICAL DATA: Left lower quadrant abdominal pain

EXAM:
TRANSABDOMINAL AND TRANSVAGINAL ULTRASOUND OF PELVIS
DOPPLER ULTRASOUND OF OVARIES
TECHNIQUE: Both transabdominal and transvaginal ultrasound examinations of the
pelvis were performed. Transabdominal technique was performed for
global imaging of the pelvis including uterus, ovaries, adnexal
regions, and pelvic cul-de-sac.
It was necessary to proceed with endovaginal exam following the
transabdominal exam to visualize the endometrium and ovaries
bilaterally. Color and duplex Doppler ultrasound was utilized to
evaluate blood flow to the ovaries.

[Series 1: us pelvic complete w transvaginal and torsion righ · 13 of 73 slices shown]
[im 1/73]
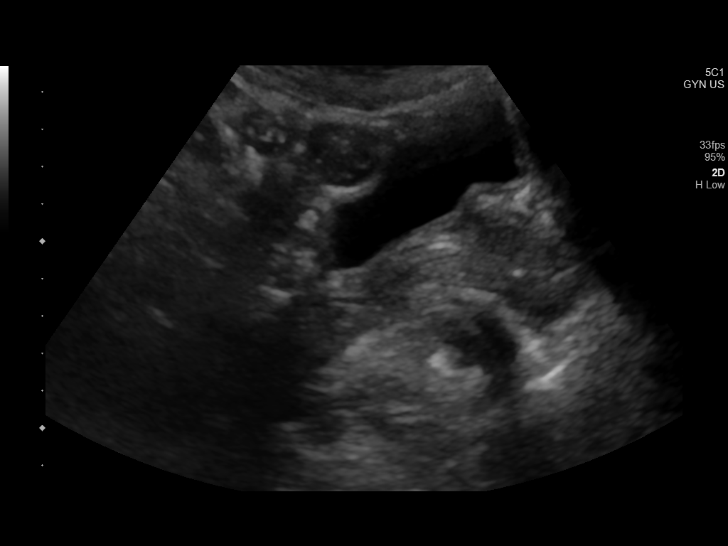
[im 7/73]
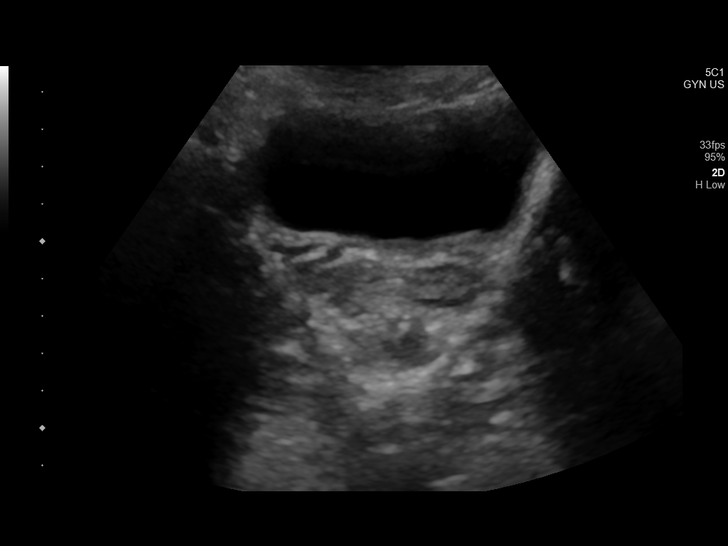
[im 13/73]
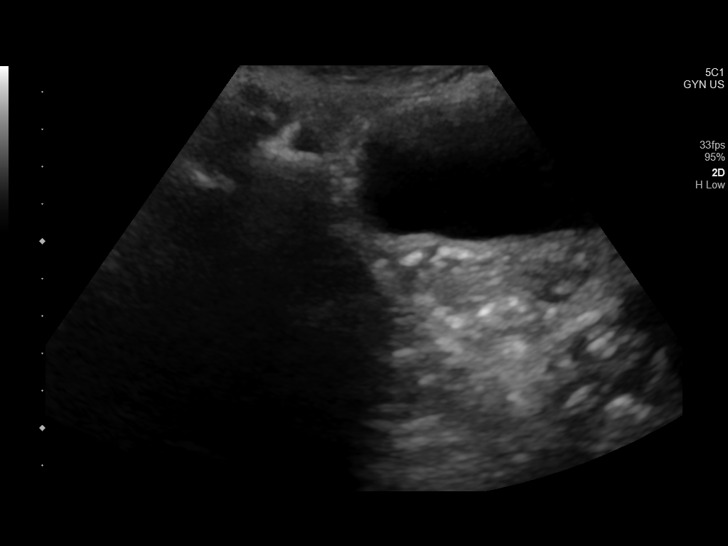
[im 19/73]
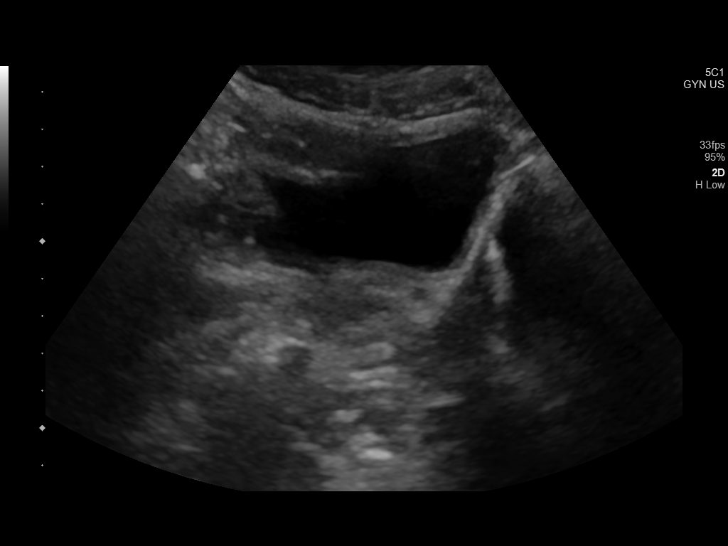
[im 25/73]
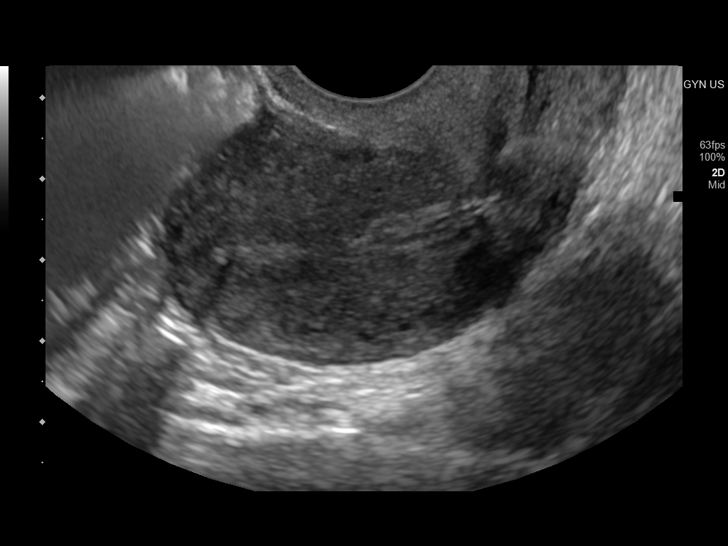
[im 31/73]
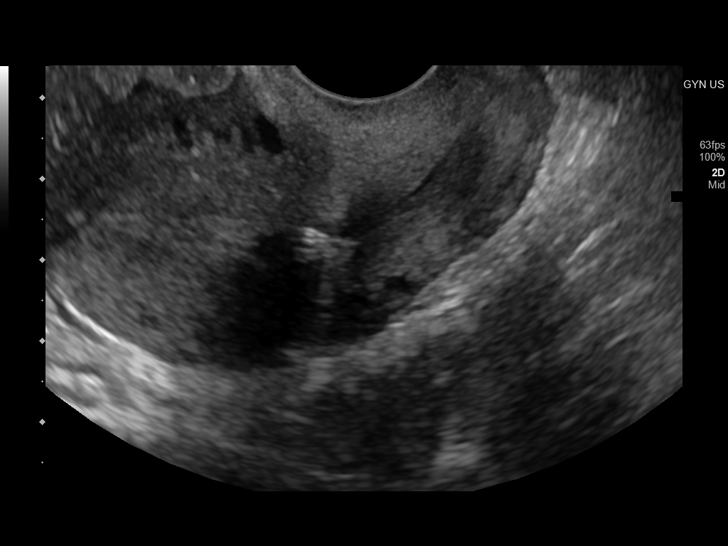
[im 37/73]
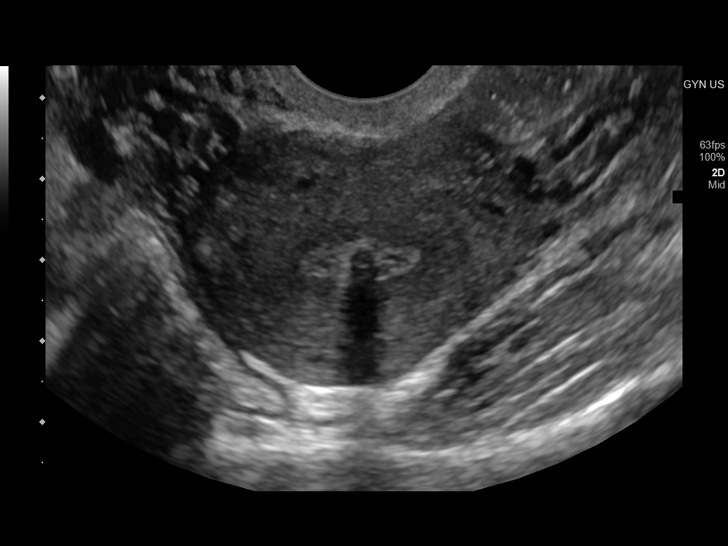
[im 43/73]
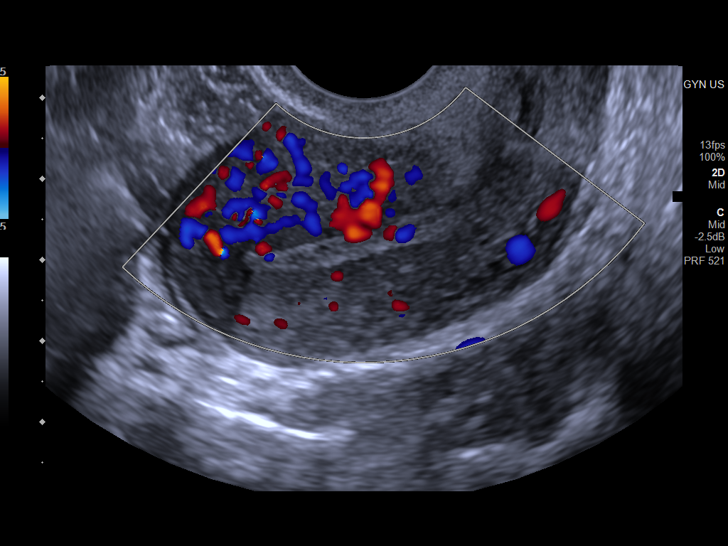
[im 49/73]
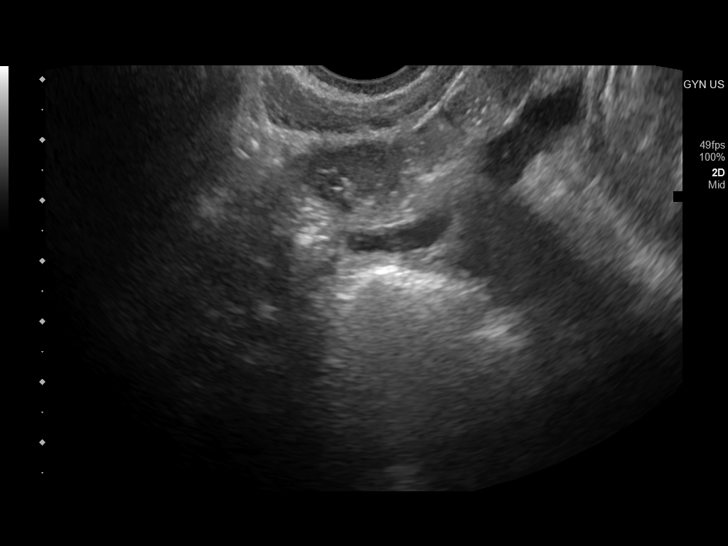
[im 55/73]
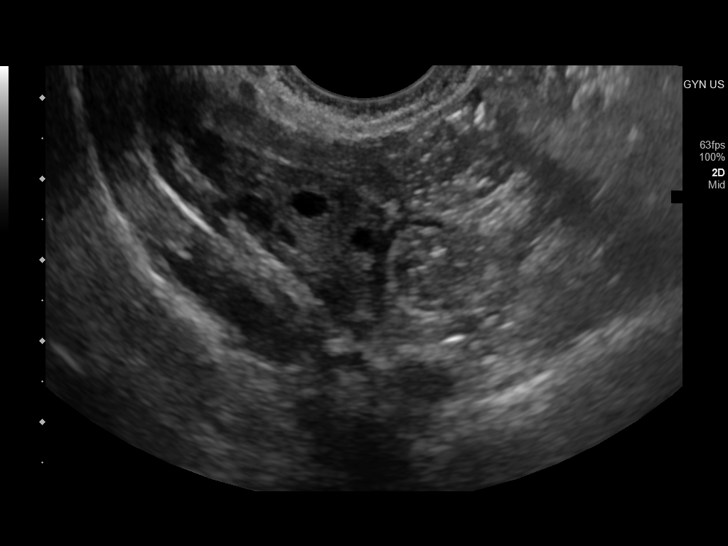
[im 61/73]
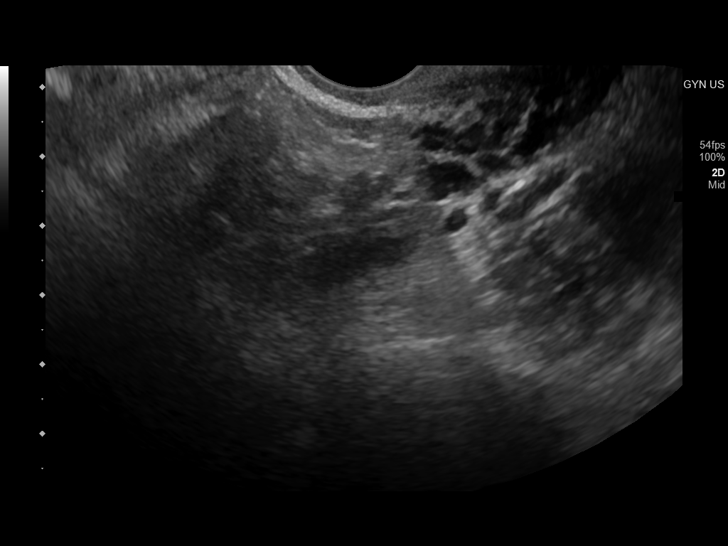
[im 67/73]
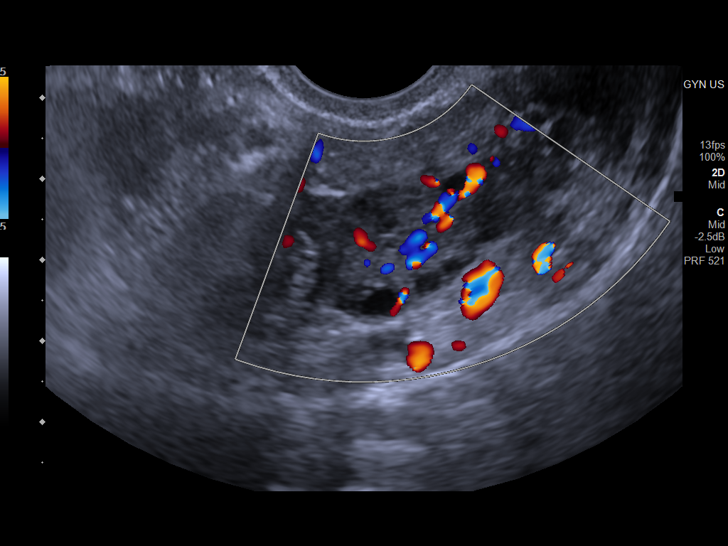
[im 73/73]
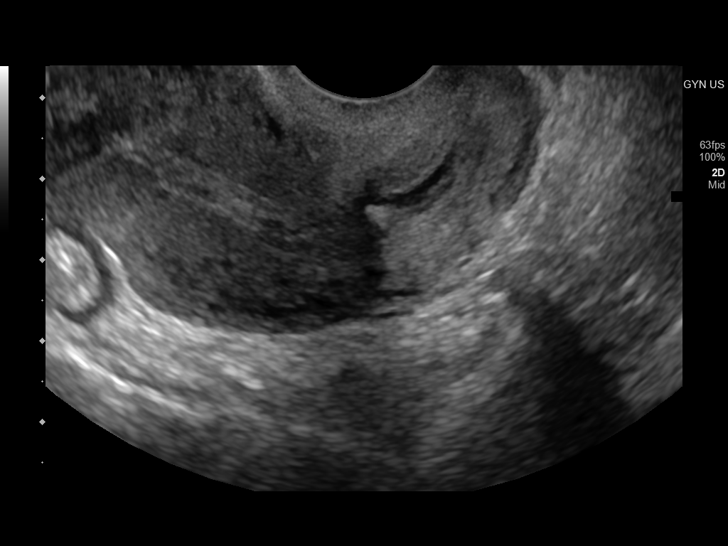

[13 of 25 positions shown; findings below may reference images not displayed]

FINDINGS: Uterus

Measurements: 6.4 x 3.4 x 4.6 cm = volume: 52 mL. No fibroids or
other mass visualized. The cervix is unremarkable.

Endometrium

Thickness: 4 mm. Intrauterine device is in expected position within
the endometrial cavity.

Right ovary

Measurements: 2.6 x 1.3 x 2.1 cm = volume: 4 mL. Normal
appearance/no adnexal mass.

Left ovary

Measurements: 3.4 x 1.8 x 2.1 cm = volume: 7 mL. Normal
appearance/no adnexal mass.

Pulsed Doppler evaluation of both ovaries demonstrates normal
low-resistance arterial and venous waveforms.

Other findings

No abnormal free fluid.
IMPRESSION: Intrauterine device in expected position. Otherwise unremarkable
examination.

## 2022-11-30 IMAGING — CT CT RENAL STONE PROTOCOL
3 of 4 series · 8 of 46 positions shown, 15 images · non-contrast
Comparison: 04/28/2013

CLINICAL DATA: Left-sided flank pain for several weeks, initial
encounter

EXAM:
CT ABDOMEN AND PELVIS WITHOUT CONTRAST
TECHNIQUE: Multidetector CT imaging of the abdomen and pelvis was performed
following the standard protocol without IV contrast.

[Series 4: lung bases · axial · 0.68mm/px · z∈[-671,-621]mm · 4 of 18 slices shown, 9 images]
[im 4/18  soft-tissue]
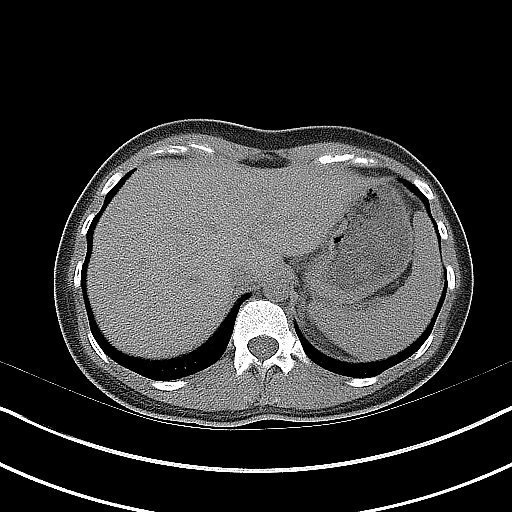
[im 4/18  lung]
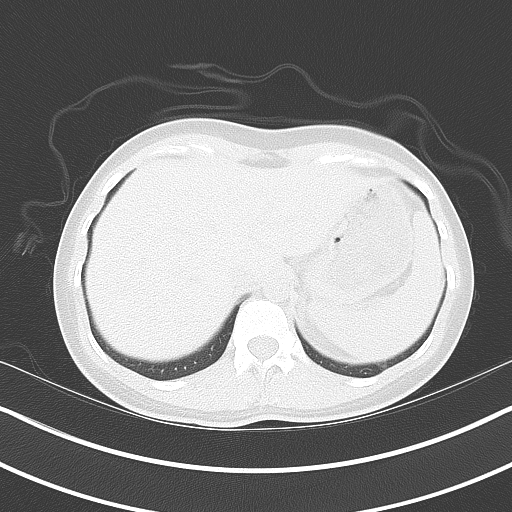
[im 4/18  bone]
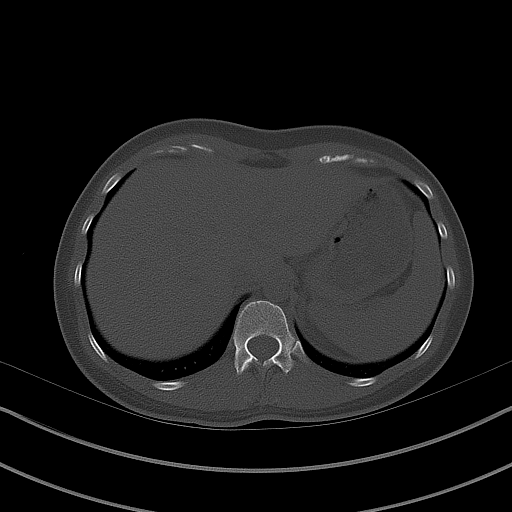
[im 7/18  soft-tissue]
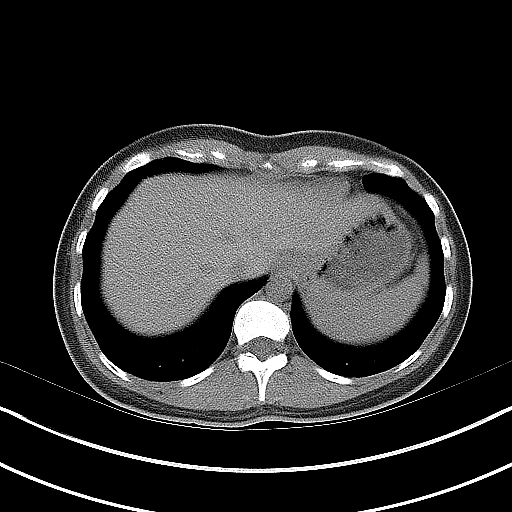
[im 7/18  lung]
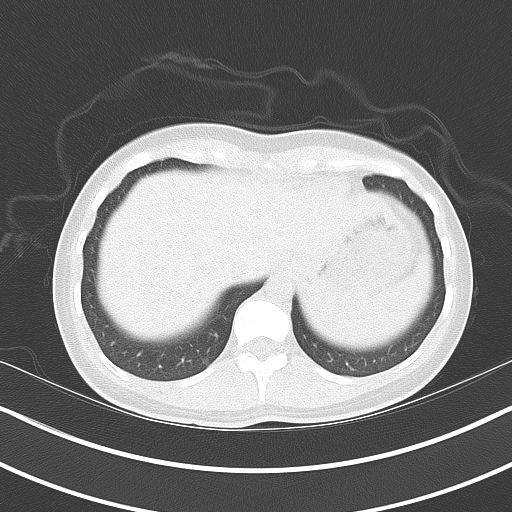
[im 11/18  soft-tissue]
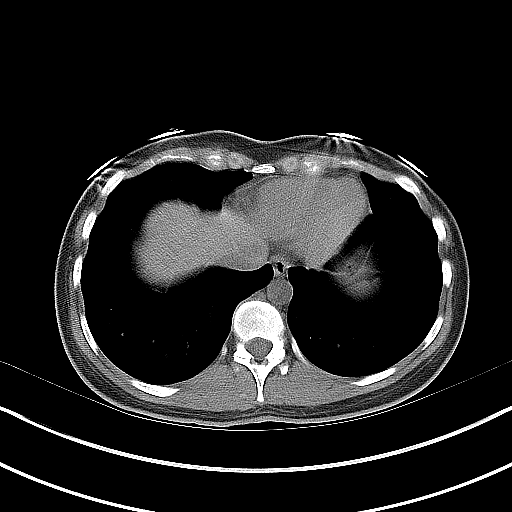
[im 11/18  lung]
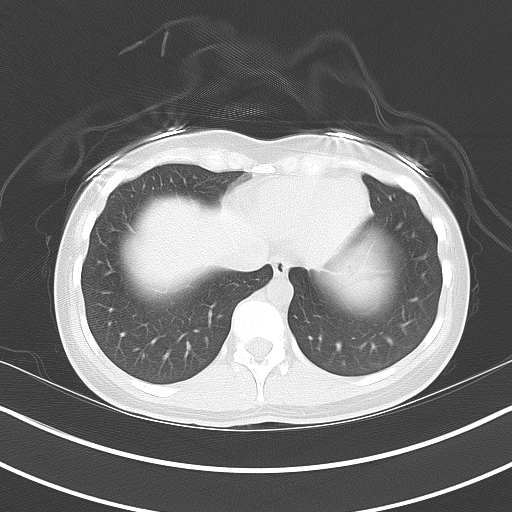
[im 14/18  soft-tissue]
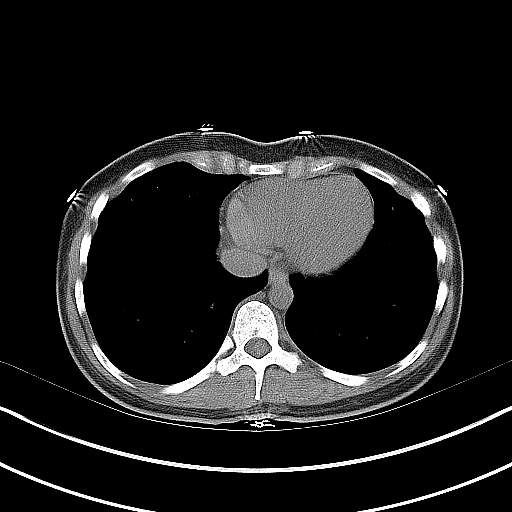
[im 14/18  lung]
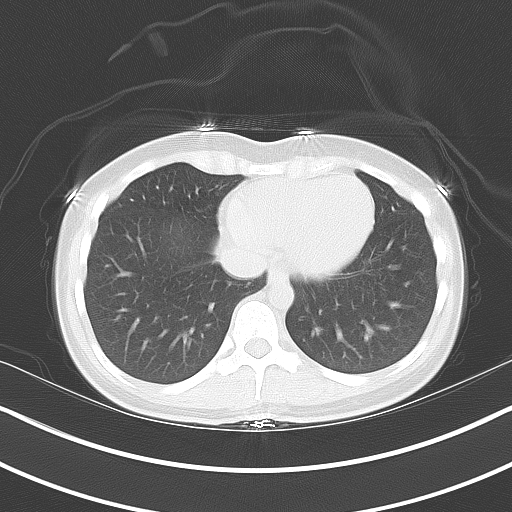

[Series 5: coronal · coronal · 0.68mm/px · 3 of 102 slices shown, 4 images]
[im 34/102  soft-tissue]
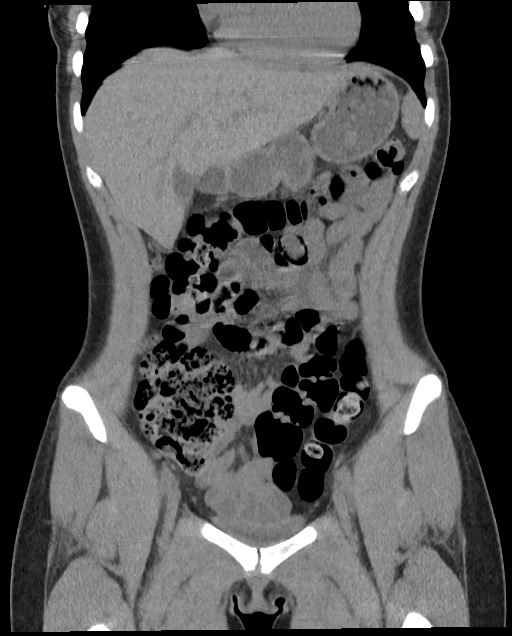
[im 45/102  soft-tissue]
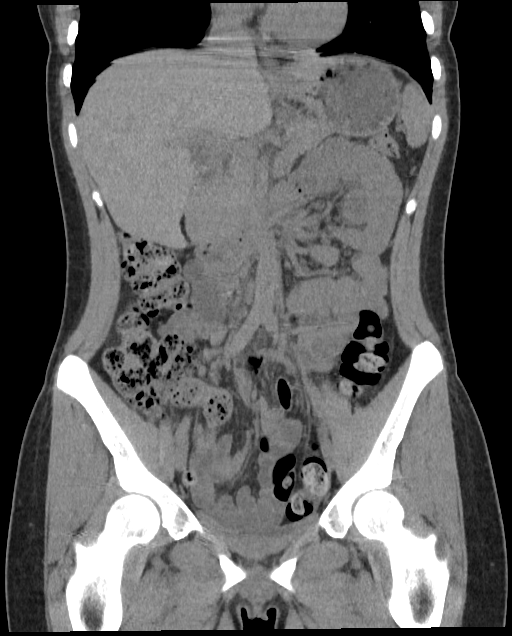
[im 45/102  bone]
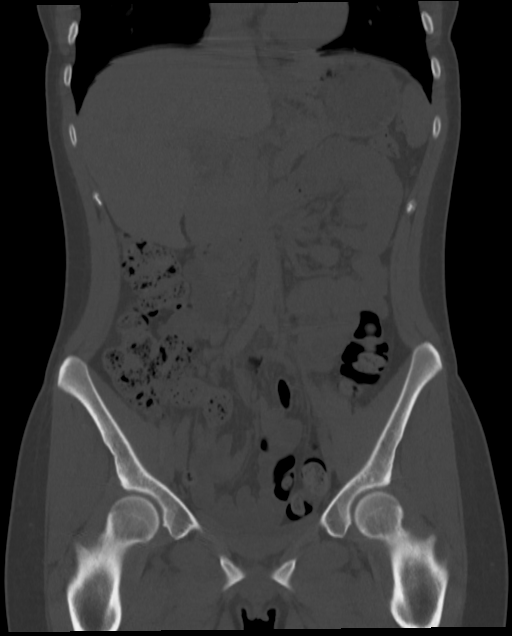
[im 57/102  soft-tissue]
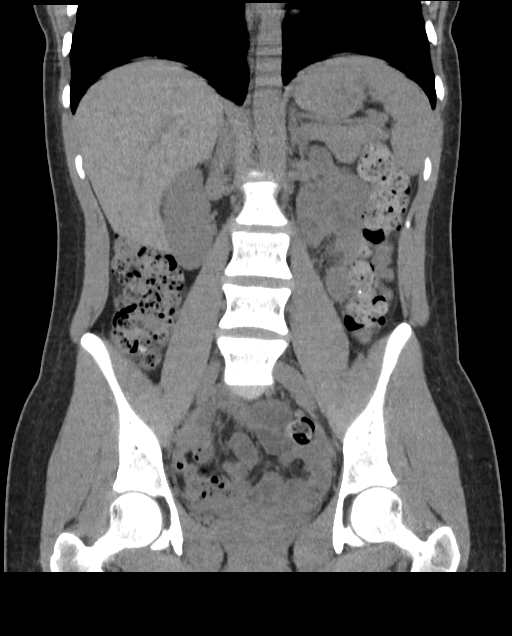

[Series 6: sagittal · sagittal · 0.43mm/px · 1 of 147 slices shown, 2 images]
[im 49/147  soft-tissue]
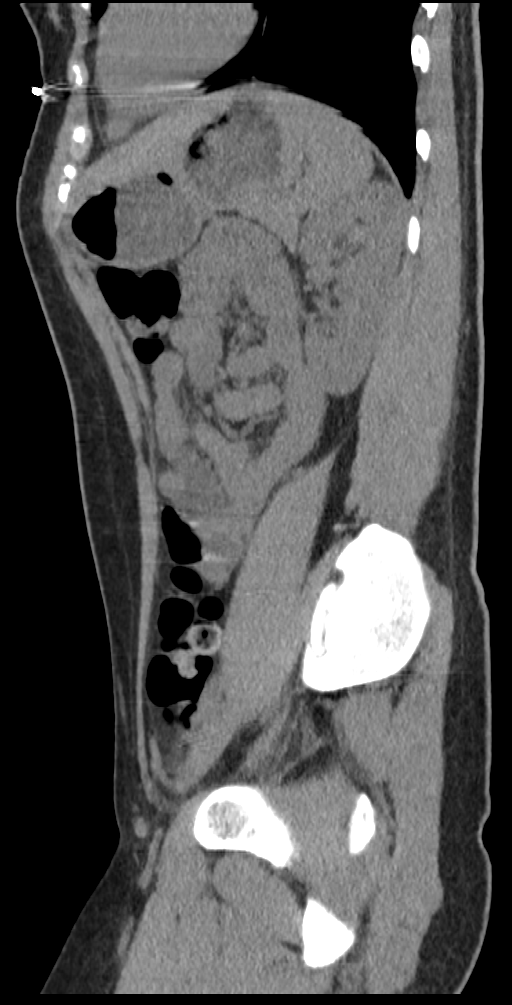
[im 49/147  bone]
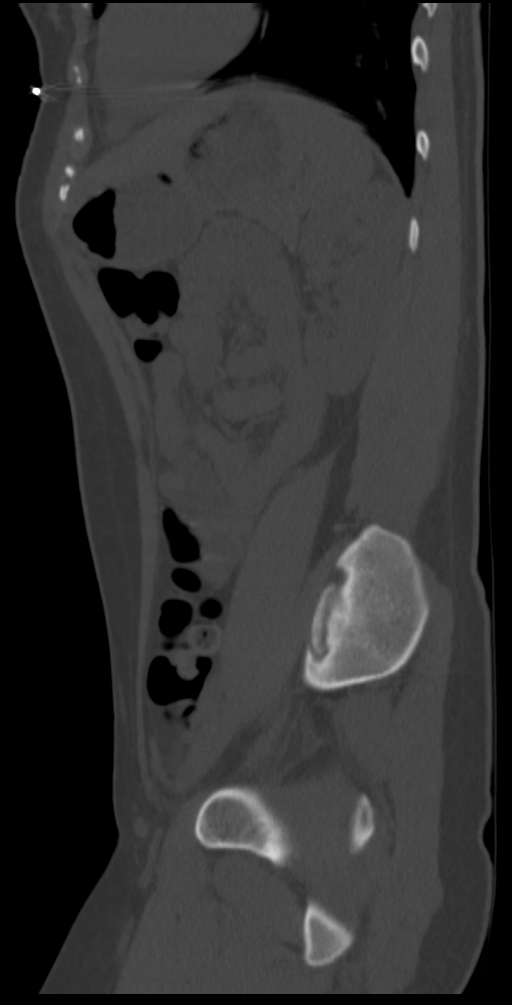

[8 of 46 positions shown; findings below may reference images not displayed]

FINDINGS: Lower chest: No acute abnormality.

Hepatobiliary: No focal liver abnormality is seen. No gallstones,
gallbladder wall thickening, or biliary dilatation.

Pancreas: Unremarkable. No pancreatic ductal dilatation or
surrounding inflammatory changes.

Spleen: Normal in size without focal abnormality.

Adrenals/Urinary Tract: Adrenal glands are within normal limits.
Kidneys are well visualized bilaterally. Punctate nonobstructing
renal calculi are noted bilaterally. No obstructive changes are
seen. The ureters are within normal limits. The bladder is
decompressed.

Stomach/Bowel: Colon shows no obstructive or inflammatory changes.
The appendix is within normal limits. Small bowel and stomach are
unremarkable.

Vascular/Lymphatic: No significant vascular findings are present. No
enlarged abdominal or pelvic lymph nodes.

Reproductive: IUD is noted within the uterus. Uterus and adnexa
appear within normal limits.

Other: No abdominal wall hernia or abnormality. No abdominopelvic
ascites.

Musculoskeletal: No acute or significant osseous findings.
IMPRESSION: Punctate nonobstructing renal calculi bilaterally. No other focal
abnormality is noted.

## 2023-07-25 ENCOUNTER — Telehealth: Payer: Self-pay

## 2023-07-25 NOTE — Telephone Encounter (Signed)
Marland Kitchen

## 2023-07-25 NOTE — Telephone Encounter (Signed)
Chart reviewed. No records found of patient being seen or appointments made. Information sent via my chart regarding folliculities cause, treatment. Advised to call to schedule appointment or be seen at walk in/Urgent care if she is in a lot of pain.

## 2023-07-30 ENCOUNTER — Ambulatory Visit: Payer: No Typology Code available for payment source | Admitting: Obstetrics

## 2023-07-30 NOTE — Progress Notes (Deleted)
    GYNECOLOGY PROGRESS NOTE  Subjective:  PCP: Corwin Antu, FNP  Patient ID: Sara Campbell, female    DOB: 05-29-2001, 23 y.o.   MRN: [id number]  HPI  Patient is a 23 y.o. G15P0010 female who presents for   {Common ambulatory SmartLinks:19316}  Review of Systems {ros; complete:30496}   Objective:   There were no vitals taken for this visit. There is no height or weight on file to calculate BMI.  General appearance: {general exam:16600} Abdomen: {abdominal exam:16834} Pelvic: {pelvic exam:16852::cervix normal in appearance,external genitalia normal,no adnexal masses or tenderness,no cervical motion tenderness,rectovaginal septum normal,uterus normal size, shape, and consistency,vagina normal without discharge} Extremities: {extremity exam:5109} Neurologic: {neuro exam:17854}   Assessment/Plan:   No diagnosis found.   There are no diagnoses linked to this encounter.     Estil Mangle, DO [facility]

## 2023-08-06 ENCOUNTER — Encounter: Payer: Self-pay | Admitting: Obstetrics

## 2023-08-07 ENCOUNTER — Ambulatory Visit: Payer: No Typology Code available for payment source | Admitting: Obstetrics

## 2023-08-15 ENCOUNTER — Encounter: Payer: Self-pay | Admitting: Obstetrics

## 2023-08-15 ENCOUNTER — Ambulatory Visit: Payer: No Typology Code available for payment source | Admitting: Obstetrics

## 2023-08-15 VITALS — BP 114/70 | HR 88 | Ht 63.0 in | Wt 117.0 lb

## 2023-08-15 DIAGNOSIS — L739 Follicular disorder, unspecified: Secondary | ICD-10-CM | POA: Diagnosis not present

## 2023-08-15 DIAGNOSIS — L731 Pseudofolliculitis barbae: Secondary | ICD-10-CM

## 2023-08-15 MED ORDER — MUPIROCIN 2 % EX OINT: 1.0000 | TOPICAL_OINTMENT | Freq: Two times a day (BID) | CUTANEOUS | 0 refills | Status: DC

## 2023-08-15 NOTE — Progress Notes (Signed)
    GYNECOLOGY PROGRESS NOTE  Subjective:  PCP: Mort Sawyers, FNP  Patient ID: Sara Campbell, female    DOB: November 24, 2000, 23 y.o.   MRN: [id number]  HPI  Patient is a 23 y.o. G30P0010 female who presents for a bump on her vulva to the left. It has been there over a month. She feels like it started as an ingrown hair, she shaves regularly. Has never had this happen before. Developed as a painful red bump, will get slightly bigger and then pop with small amount of pus like a pimple. Will then scab over, but never fully goes away. Will get irritated again, scab will fall off and then becomes like a pimple again, cycle continues. She did try putting a bandage over it, but it was too large and irritated her inguinal fold.   The following portions of the patient's history were reviewed and updated as appropriate: allergies, current medications, past family history, past medical history, past social history, past surgical history, and problem list.  Review of Systems Pertinent items are noted in HPI.   Objective:   Blood pressure 114/70, pulse 88, height 5\' 3"  (1.6 m), weight 117 lb (53.1 kg). Body mass index is 20.73 kg/m.  General appearance: alert, cooperative, and no distress Abdomen: soft, non-tender; bowel sounds normal; no masses,  no organomegaly Pelvic:  Left side of mons with a 5mm bump, border with pink new skin, healing well. Centrally with a scab. No vesicle or pustule, non-fluctuant, no induration, edema or erythema.  Extremities: extremities normal, atraumatic, no cyanosis or edema Neurologic: Grossly normal   Assessment/Plan:   1. Ingrown hair   2. Folliculitis   Pt with ingrown hair leading to folliculitis, now healing well with a central scab. Discussed giving more time and treating with topicals, versus excision, pt prefers topicals.  -Keep clean and dry.  -Rx for Mupirocin, use BID x 2 weeks. Also instructed to purchase OTC HC cream and apply BID to help with the  hypertrophic border.  -Keep covered daily with a smaller bandage for two weeks, discussed how this will aid in healing.   -Notify clinic if not improving after two weeks.     Julieanne Manson, DO [facility]

## 2023-08-15 NOTE — Addendum Note (Signed)
 Addended by: Cornelius Moras D on: 08/15/2023 01:50 PM   Modules accepted: Orders

## 2023-09-12 ENCOUNTER — Ambulatory Visit: Admitting: Obstetrics

## 2023-09-12 ENCOUNTER — Encounter: Payer: Self-pay | Admitting: Obstetrics

## 2023-09-12 VITALS — BP 120/85 | HR 100 | Ht 63.0 in | Wt 112.0 lb

## 2023-09-12 DIAGNOSIS — G43109 Migraine with aura, not intractable, without status migrainosus: Secondary | ICD-10-CM | POA: Diagnosis not present

## 2023-09-12 DIAGNOSIS — L989 Disorder of the skin and subcutaneous tissue, unspecified: Secondary | ICD-10-CM | POA: Diagnosis not present

## 2023-09-12 NOTE — Progress Notes (Signed)
    GYNECOLOGY PROGRESS NOTE  Subjective:  PCP: Mort Sawyers, FNP  Patient ID: Sara Campbell, female    DOB: 2001/05/30, 23 y.o.   MRN: [id number]  HPI  Patient is a 23 y.o. G25P0010 female who presents for follow up from a ingrown hair on her pubic mons. We last addressed this on 08/15/23, looked like folliculitis with some hypertrophic edges forming around the central scab. Prescribed mupirocin and pt has been diligently applying and keeping wound covered, avoiding rubbing from clothing. She states it will get almost better, but then regress again. We had previously discussed excision, pt desires to do "everything possible" before cutting it out.   Question about her BP, noticed that it was higher when she was smiling and trying to get the veins on her face to pop out. Wondering if that is worrisome. Has had prior discussions about her BP with PCP and is always normal, no concerns. No symptoms.   Also questions about migraines. Has chronic, multiple times per week and is managed by neurology, number has decreased once she got on Zonegran. She still continues to have them frequently enough that they bother her. Wondering if Mirena IUD could be contributory.    The following portions of the patient's history were reviewed and updated as appropriate: allergies, current medications, past family history, past medical history, past social history, past surgical history, and problem list.  Review of Systems Pertinent items are noted in HPI.   Objective:   Blood pressure 120/85, pulse 100, height 5\' 3"  (1.6 m), weight 112 lb (50.8 kg). Body mass index is 19.84 kg/m.  General appearance: alert, cooperative, and no distress Abdomen: soft, non-tender; bowel sounds normal; no masses,  no organomegaly Pelvic:  Left side of mons with a 3-3mm raised, blood-filled lesion, is now more elongated from prior, scab gone. No ulceration or exudate, non-fluctuant, no induration, edema or erythema.  Extremities:  extremities normal, atraumatic, no cyanosis or edema Neurologic: Grossly normal   Assessment/Plan:   1. Non-healing skin lesion   2. Migraine with aura and without status migrainosus, not intractable     Pt with same lesion as 1 month prior, initially thought to be folliculitis, but non-healing after using mupirocin and keeping covered. Will send dermatology referral, as pt wants to exhaust all avenues.   Reassurance regarding her BP, discussed how any valsalva or flexing can alter a blood pressure reading. Her BP is normal for [place] today, no reason for concern.   We discussed that her Mirena IUD, while not likely, could be contributory to her migraine headaches. Encouraged her to discuss alternative medications, possibly injections (Emgality, Aimovig) and/or Botox with her neurologist. She will need to weigh how much she wants the IUD for contraception versus a trial of IUD removal for headache improvement. We can remove IUD if she decides.    Julieanne Manson, DO [facility]

## 2024-04-24 ENCOUNTER — Ambulatory Visit: Admitting: Family Medicine

## 2024-04-28 ENCOUNTER — Encounter: Payer: Self-pay | Admitting: Family

## 2024-04-28 ENCOUNTER — Ambulatory Visit (INDEPENDENT_AMBULATORY_CARE_PROVIDER_SITE_OTHER): Admitting: Family

## 2024-04-28 VITALS — BP 102/60 | HR 86 | Temp 98.6°F | Ht 63.0 in | Wt 116.0 lb

## 2024-04-28 DIAGNOSIS — G44229 Chronic tension-type headache, not intractable: Secondary | ICD-10-CM

## 2024-04-28 LAB — SEDIMENTATION RATE: Sed Rate: 1 mm/h (ref 0–20)

## 2024-04-28 LAB — C-REACTIVE PROTEIN: CRP: <0.5 mg/dL (ref 0.5–20.0)

## 2024-04-28 MED ORDER — CYCLOBENZAPRINE HCL 5 MG PO TABS
5.0000 mg | ORAL_TABLET | Freq: Three times a day (TID) | ORAL | 1 refills | Status: AC | PRN
Start: 2024-04-28 — End: ?

## 2024-04-28 MED ORDER — PROPRANOLOL HCL 20 MG PO TABS: 20.0000 mg | ORAL_TABLET | Freq: Every day | ORAL | 1 refills | Status: DC

## 2024-04-28 MED ORDER — BUTALBITAL-APAP-CAFFEINE 50-325-40 MG PO TABS: 1.0000 | ORAL_TABLET | Freq: Four times a day (QID) | ORAL | 0 refills | Status: AC | PRN

## 2024-04-28 NOTE — Progress Notes (Signed)
 Established Patient Office Visit  Subjective:      CC:  Chief Complaint  Patient presents with   Headache    HPI: Sara Campbell is a 23 y.o. female presenting on 04/28/2024 for Headache .  Discussed the use of AI scribe software for clinical note transcription with the patient, who gave verbal consent to proceed.  History of Present Illness Sara Campbell is a 23 year old female who presents with persistent headaches.  She has experienced headaches since childhood, with an increase in frequency over the past two to three years to three to four times a week, though recently she has decreased to two to three times a week. The headaches are often preceded by tension in her shoulders, described as 'full blown knots.'  She has tried various treatments including chiropractic care, trigger point injections, and multiple medications without significant relief. Sumatriptan  was prescribed in 2023 but did not help. She has also used Excedrin Migraine and acetaminophen , which have become less effective over time. Zonisamide was used as a prophylactic treatment for about a year without benefit. Cyclobenzaprine , a muscle relaxer, provided some relief.  During headaches, she experiences photophobia, phonophobia, and sometimes sees auras. She reports nausea and occasional vomiting, which can be severe enough to disrupt her daily activities and work. She uses a heated rice bag and topical analgesics like Icy Hot for symptomatic relief.  She maintains hydration and limits soda intake to one per day. She has tried neck exercises to alleviate tension. She thinks she had a recent eye exam within the last year. No numbness, tingling, or weakness in her extremities. Stress levels are described as normal, and she denies significant anxiety or depression.         Social history:  Relevant past medical, surgical, family and social history reviewed and updated as indicated. Interim medical history since  our last visit reviewed.  Allergies and medications reviewed and updated.  DATA REVIEWED: CHART IN EPIC     ROS: Negative unless specifically indicated above in HPI.    Current Outpatient Medications:    butalbital-acetaminophen -caffeine (FIORICET) 50-325-40 MG tablet, Take 1 tablet by mouth every 6 (six) hours as needed for headache., Disp: 14 tablet, Rfl: 0   cyclobenzaprine  (FLEXERIL ) 5 MG tablet, Take 1 tablet (5 mg total) by mouth 3 (three) times daily as needed for muscle spasms., Disp: 30 tablet, Rfl: 1   levonorgestrel  (MIRENA ) 20 MCG/DAY IUD, 1 each by Intrauterine route once., Disp: , Rfl:    propranolol (INDERAL) 20 MG tablet, Take 1 tablet (20 mg total) by mouth daily., Disp: 30 tablet, Rfl: 1        Objective:        BP 102/60 (BP Location: Right Arm, Patient Position: Sitting, Cuff Size: Normal)   Pulse 86   Temp 98.6 F (37 C) (Temporal)   Ht 5' 3 (1.6 m)   Wt 116 lb (52.6 kg)   SpO2 100%   BMI 20.55 kg/m   Physical Exam   Wt Readings from Last 3 Encounters:  04/28/24 116 lb (52.6 kg)  09/12/23 112 lb (50.8 kg)  08/15/23 117 lb (53.1 kg)    Physical Exam Vitals reviewed.  Constitutional:      General: She is not in acute distress.    Appearance: Normal appearance. She is normal weight. She is not ill-appearing, toxic-appearing or diaphoretic.  HENT:     Head: Normocephalic.  Cardiovascular:     Rate and Rhythm: Normal rate.  Pulmonary:  Effort: Pulmonary effort is normal.  Musculoskeletal:        General: Normal range of motion.     Cervical back: Muscular tenderness present. No pain with movement or spinous process tenderness.  Neurological:     General: No focal deficit present.     Mental Status: She is alert and oriented to person, place, and time. Mental status is at baseline.     Cranial Nerves: Cranial nerves 2-12 are intact. No cranial nerve deficit or facial asymmetry.     Sensory: Sensation is intact.     Motor: Motor  function is intact.  Psychiatric:        Mood and Affect: Mood normal.        Behavior: Behavior normal.        Thought Content: Thought content normal.        Judgment: Judgment normal.          Results   Assessment & Plan:   Assessment and Plan Assessment & Plan Chronic tension-type headache with migraine features Chronic tension-type headaches with migraine features, occurring 2-3 times per week, with associated photophobia, phonophobia, nausea, and occasional vomiting. Symptoms have persisted since childhood, with increased frequency over the past two years. Previous treatments, including sumatriptan , zonisamide, and trigger point injections, have been ineffective. Cyclobenzaprine  provided some relief. Current management includes Excedrin Migraine, which is inconsistently effective. No significant stressors identified. No recent eye exam, but she reports a recent exam within the past year. No significant depressive or anxiety symptoms reported. No rebound headaches or medication overuse noted. No significant nerve pain or weakness reported. No significant fatigue reported. No significant allergies noted. No significant reflux symptoms reported. - Refilled cyclobenzaprine  for use at night when tension is severe. - Initiated propranolol 20 mg daily as a preventative measure for tension-type headaches and migraines. - Prescribed Fioricet for acute management of headaches when tension is severe. - Ordered thyroid  function tests, sed rate, and CRP to evaluate for underlying inflammatory conditions. - Advised to monitor blood pressure and heart rate due to propranolol use. - Instructed to start propranolol and Fioricet separately to assess individual efficacy. - Advised to avoid abrupt discontinuation of propranolol to prevent rebound hypertension. - Will consider referral to a different neurologist if current management is ineffective.        Return in about 3 weeks (around  05/19/2024) for f/u headache.     Ginger Patrick, MSN, APRN, FNP-C [facility]

## 2024-04-29 LAB — TSH: TSH: 1.31 u[IU]/mL (ref 0.35–5.50)

## 2024-04-30 ENCOUNTER — Ambulatory Visit: Payer: Self-pay | Admitting: Family

## 2024-05-18 ENCOUNTER — Encounter: Payer: Self-pay | Admitting: Family

## 2024-05-18 ENCOUNTER — Ambulatory Visit (INDEPENDENT_AMBULATORY_CARE_PROVIDER_SITE_OTHER): Admitting: Family

## 2024-05-18 VITALS — BP 106/80 | HR 83 | Temp 98.5°F | Ht 63.0 in | Wt 115.6 lb

## 2024-05-18 DIAGNOSIS — G43109 Migraine with aura, not intractable, without status migrainosus: Secondary | ICD-10-CM | POA: Diagnosis not present

## 2024-05-18 NOTE — Progress Notes (Signed)
 Established Patient Office Visit  Subjective:      CC:  Chief Complaint  Patient presents with   Follow-up    3 week follow up    HPI: Sara Campbell is a 23 y.o. female presenting on 05/18/2024 for Follow-up (3 week follow up)   Discussed the use of AI scribe software for clinical note transcription with the patient, who gave verbal consent to proceed.  History of Present Illness Sara Campbell is a 23 year old female who presents with tension headaches and migraines.  She has experienced an increase in headache frequency over the past two to three years, initially occurring three to four times a week. Recently, the frequency has decreased to one to two times a week, which she attributes to the use of propranolol  and Fioricet. She started propranolol  after trying Fioricet, which she found slightly helpful. Fioricet does not cause drowsiness, unlike cyclobenzaprine , which she used previously and found it made her too drowsy to function during the day.  Her headaches are described as tension-related, often not escalating into full-blown migraines. She experiences significant muscular tension, particularly in her neck and shoulders, which she believes contributes to her headaches. She has not used a massage gun but has tried other tension-relief tools with limited success.  She is currently taking propranolol  20 mg daily, uses Fioricet for acute headache episodes, and takes cyclobenzaprine  at night. She has noticed a positive change in her headache frequency and intensity since starting these medications.  Her blood pressure at home was 127/86, which is higher than her usual readings at the clinic, but she attributes this to different conditions at home. She has a history of low blood pressure, which is monitored due to her use of propranolol .  No adverse effects from Fioricet and no pain when moving her head side to side.         Social history:  Relevant past medical,  surgical, family and social history reviewed and updated as indicated. Interim medical history since our last visit reviewed.  Allergies and medications reviewed and updated.  DATA REVIEWED: CHART IN EPIC     ROS: Negative unless specifically indicated above in HPI.    Current Outpatient Medications:    butalbital -acetaminophen -caffeine  (FIORICET) 50-325-40 MG tablet, Take 1 tablet by mouth every 6 (six) hours as needed for headache., Disp: 14 tablet, Rfl: 0   cyclobenzaprine  (FLEXERIL ) 5 MG tablet, Take 1 tablet (5 mg total) by mouth 3 (three) times daily as needed for muscle spasms., Disp: 30 tablet, Rfl: 1   levonorgestrel  (MIRENA ) 20 MCG/DAY IUD, 1 each by Intrauterine route once., Disp: , Rfl:    propranolol  (INDERAL ) 20 MG tablet, Take 1 tablet (20 mg total) by mouth daily., Disp: 30 tablet, Rfl: 1        Objective:        BP 106/80 (BP Location: Right Arm, Patient Position: Sitting, Cuff Size: Normal)   Pulse 83   Temp 98.5 F (36.9 C) (Temporal)   Ht 5' 3 (1.6 m)   Wt 115 lb 9.6 oz (52.4 kg)   SpO2 99%   BMI 20.48 kg/m   Physical Exam NECK: Neck tightness.  Wt Readings from Last 3 Encounters:  05/18/24 115 lb 9.6 oz (52.4 kg)  04/28/24 116 lb (52.6 kg)  09/12/23 112 lb (50.8 kg)    Physical Exam Constitutional:      General: She is not in acute distress.    Appearance: Normal appearance. She is normal weight. She is  not ill-appearing, toxic-appearing or diaphoretic.  HENT:     Head: Normocephalic.  Cardiovascular:     Rate and Rhythm: Normal rate.  Pulmonary:     Effort: Pulmonary effort is normal.  Musculoskeletal:        General: Normal range of motion.     Cervical back: Muscular tenderness present. No pain with movement or spinous process tenderness.  Neurological:     General: No focal deficit present.     Mental Status: She is alert and oriented to person, place, and time. Mental status is at baseline.  Psychiatric:        Mood and Affect:  Mood normal.        Behavior: Behavior normal.        Thought Content: Thought content normal.        Judgment: Judgment normal.          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 Propranolol  20 mg daily has been effective in reducing frequency and severity. Fioricet provides relief for acute episodes without causing drowsiness. Cyclobenzaprine  is used as needed for muscle tension, particularly at night. Blood pressure is low but within normal range at home, with no adverse effects from propranolol . - Continue propranolol  20 mg daily. We can increase to 40 mg once daily if needed. - Use Fioricet as needed for acute headache episodes. - Use cyclobenzaprine  at night for muscle tension. - Consider massage gun for muscle tension relief. - Engage in neck exercises, massage, and heat therapy. - Monitor headache frequency and severity; will consider increasing propranolol  dose if needed.        Return in about 6 months (around 11/15/2024) for f/u CPE.     Ginger Patrick, MSN, APRN, FNP-C [facility]

## 2024-05-21 ENCOUNTER — Other Ambulatory Visit: Payer: Self-pay | Admitting: Family

## 2024-05-21 DIAGNOSIS — G44229 Chronic tension-type headache, not intractable: Secondary | ICD-10-CM

## 2024-06-08 ENCOUNTER — Encounter: Payer: Self-pay | Admitting: Family

## 2024-06-10 MED ORDER — PROPRANOLOL HCL 40 MG PO TABS: 40.0000 mg | ORAL_TABLET | Freq: Every day | ORAL | 0 refills | Status: AC

## 2024-07-01 ENCOUNTER — Encounter: Payer: Self-pay | Admitting: Obstetrics

## 2024-07-14 ENCOUNTER — Telehealth

## 2024-07-14 ENCOUNTER — Ambulatory Visit: Admitting: Family

## 2024-07-17 ENCOUNTER — Ambulatory Visit: Admitting: Family

## 2024-07-28 ENCOUNTER — Ambulatory Visit: Admitting: Obstetrics

## 2024-07-29 ENCOUNTER — Other Ambulatory Visit: Payer: Self-pay | Admitting: Neurology

## 2024-07-29 DIAGNOSIS — G43719 Chronic migraine without aura, intractable, without status migrainosus: Secondary | ICD-10-CM

## 2024-08-02 ENCOUNTER — Ambulatory Visit: Admission: RE | Admit: 2024-08-02

## 2024-08-03 ENCOUNTER — Other Ambulatory Visit

## 2024-11-17 ENCOUNTER — Encounter: Admitting: Family
